# Patient Record
Sex: Female | Born: 2000
Health system: Southern US, Community
[De-identification: ages and names within clinical notes are randomized; demographics above are authoritative.]

## PROBLEM LIST (undated history)

## (undated) DIAGNOSIS — K8020 Calculus of gallbladder without cholecystitis without obstruction: Secondary | ICD-10-CM

## (undated) DIAGNOSIS — F4310 Post-traumatic stress disorder, unspecified: Secondary | ICD-10-CM

## (undated) DIAGNOSIS — B009 Herpesviral infection, unspecified: Secondary | ICD-10-CM

## (undated) DIAGNOSIS — T7840XA Allergy, unspecified, initial encounter: Secondary | ICD-10-CM

## (undated) DIAGNOSIS — F909 Attention-deficit hyperactivity disorder, unspecified type: Secondary | ICD-10-CM

## (undated) DIAGNOSIS — F32A Depression, unspecified: Secondary | ICD-10-CM

## (undated) DIAGNOSIS — F329 Major depressive disorder, single episode, unspecified: Secondary | ICD-10-CM

## (undated) DIAGNOSIS — F913 Oppositional defiant disorder: Secondary | ICD-10-CM

## (undated) DIAGNOSIS — R569 Unspecified convulsions: Secondary | ICD-10-CM

## (undated) HISTORY — PX: TONSILLECTOMY: SUR1361

## (undated) HISTORY — PX: APPENDECTOMY: SHX54

---
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
## 2019-01-12 DIAGNOSIS — B009 Herpesviral infection, unspecified: Secondary | ICD-10-CM | POA: Insufficient documentation

## 2019-01-12 DIAGNOSIS — Z3682 Encounter for antenatal screening for nuchal translucency: Secondary | ICD-10-CM | POA: Diagnosis not present

## 2019-01-12 DIAGNOSIS — O98519 Other viral diseases complicating pregnancy, unspecified trimester: Secondary | ICD-10-CM | POA: Insufficient documentation

## 2019-01-12 DIAGNOSIS — Z3A12 12 weeks gestation of pregnancy: Secondary | ICD-10-CM | POA: Diagnosis not present

## 2019-03-05 DIAGNOSIS — F439 Reaction to severe stress, unspecified: Secondary | ICD-10-CM | POA: Diagnosis not present

## 2019-03-27 DIAGNOSIS — Z3A22 22 weeks gestation of pregnancy: Secondary | ICD-10-CM | POA: Diagnosis not present

## 2019-03-27 DIAGNOSIS — Z3689 Encounter for other specified antenatal screening: Secondary | ICD-10-CM | POA: Diagnosis not present

## 2019-06-29 IMAGING — CT CT ABD-PELV W/ CM
2 of 4 series · 16 of 46 positions shown, 18 images · IV contrast (Isovue)
Comparison: 01/18/2013

CLINICAL DATA: Left lower quadrant abdominal pain

EXAM:
CT ABDOMEN AND PELVIS WITH CONTRAST
TECHNIQUE: Multidetector CT imaging of the abdomen and pelvis was performed
using the standard protocol following bolus administration of
intravenous contrast.
CONTRAST:  100mL [id number] IOPAMIDOL ([id number]) INJECTION 61%

[Series 2: axial st · axial · 0.73mm/px · z∈[+1091,+1481]mm · 13 of 86 slices shown, 15 images]
[im 4/86  soft-tissue]
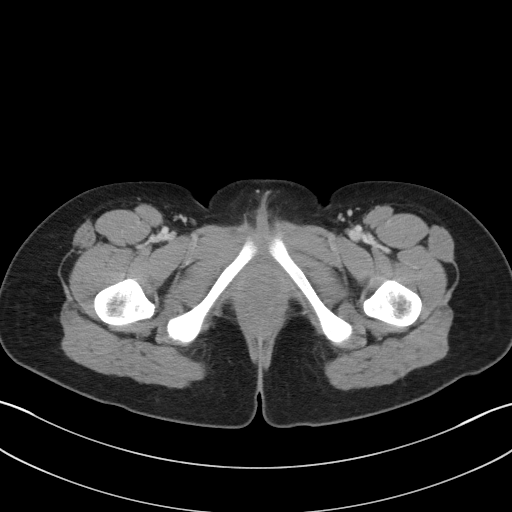
[im 4/86  bone]
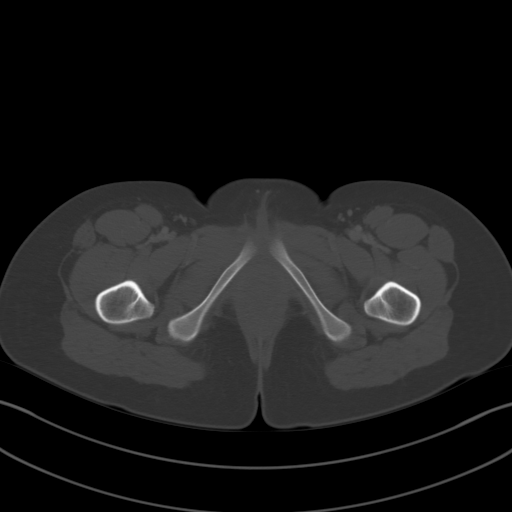
[im 12/86  soft-tissue]
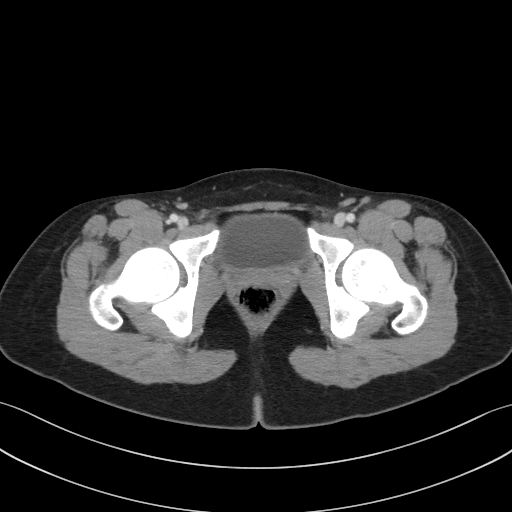
[im 19/86  soft-tissue]
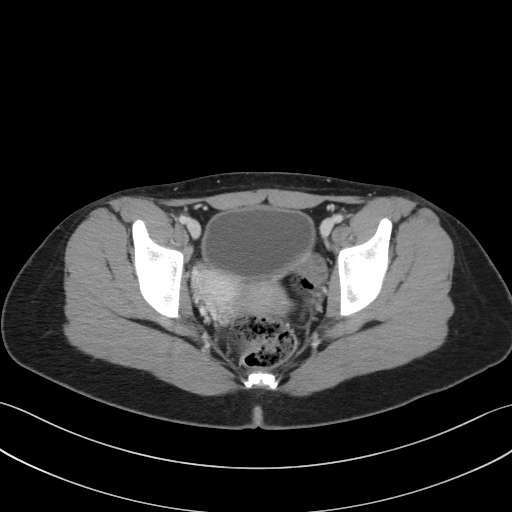
[im 23/86  soft-tissue]
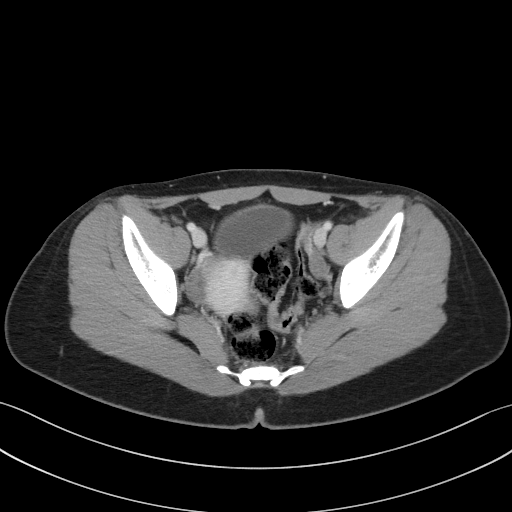
[im 30/86  soft-tissue]
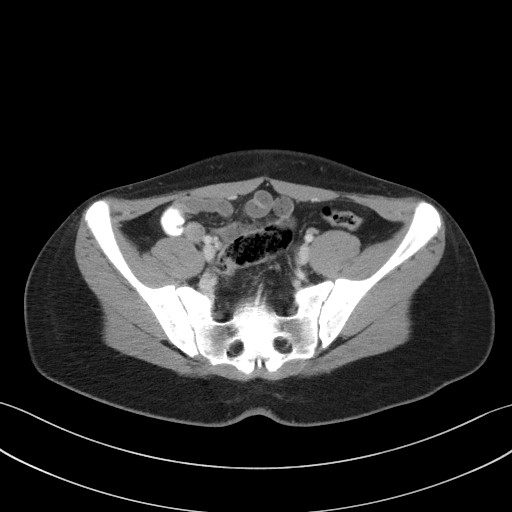
[im 37/86  soft-tissue]
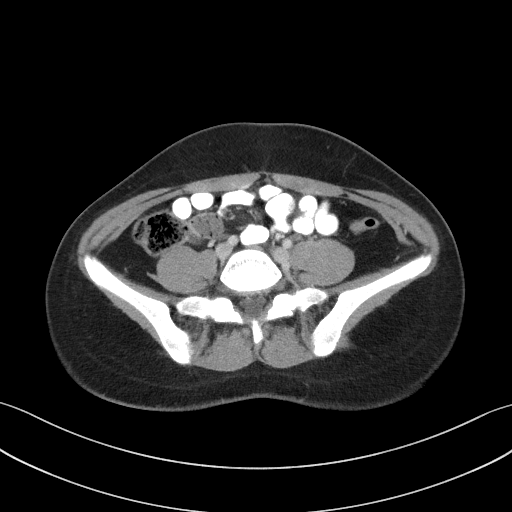
[im 45/86  soft-tissue]
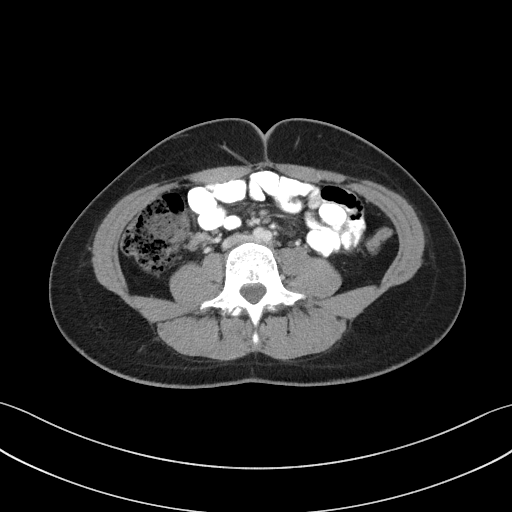
[im 49/86  soft-tissue]
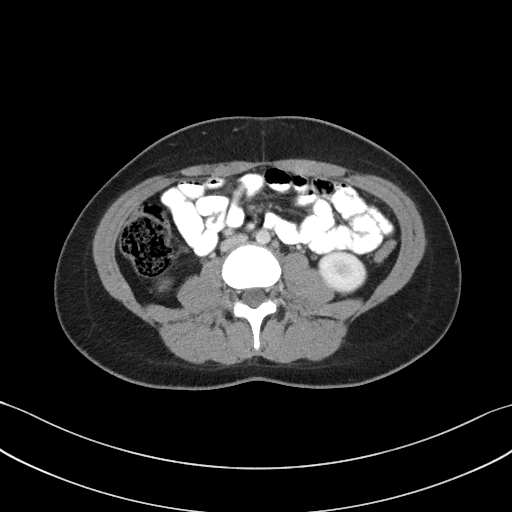
[im 56/86  soft-tissue]
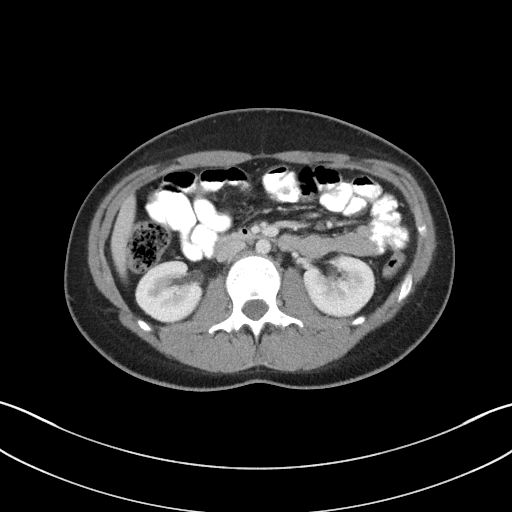
[im 56/86  bone]
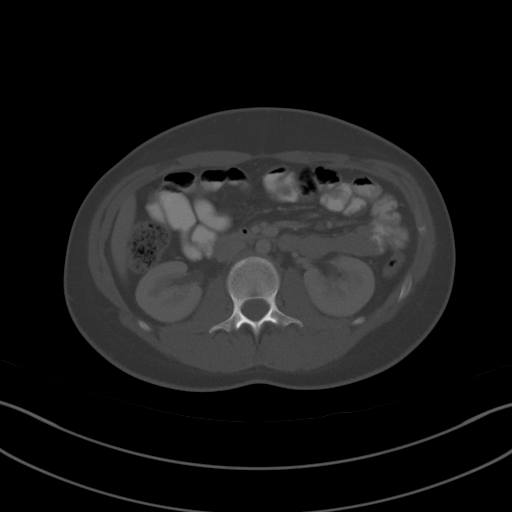
[im 63/86  soft-tissue]
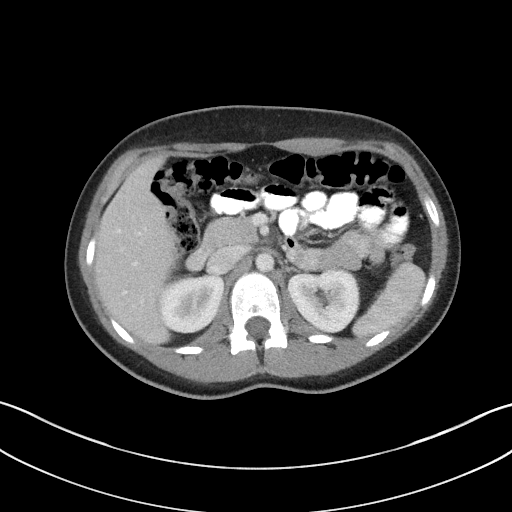
[im 67/86  soft-tissue]
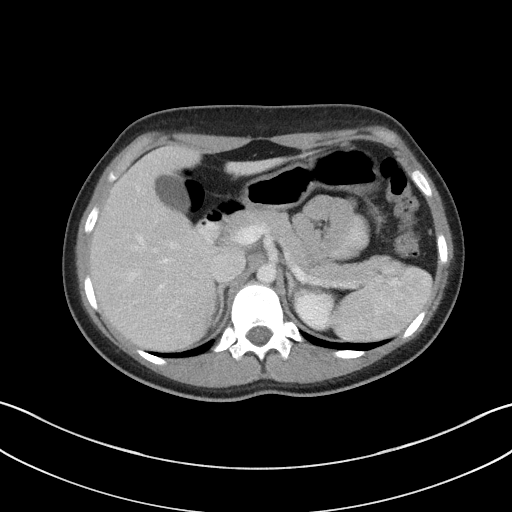
[im 74/86  soft-tissue]
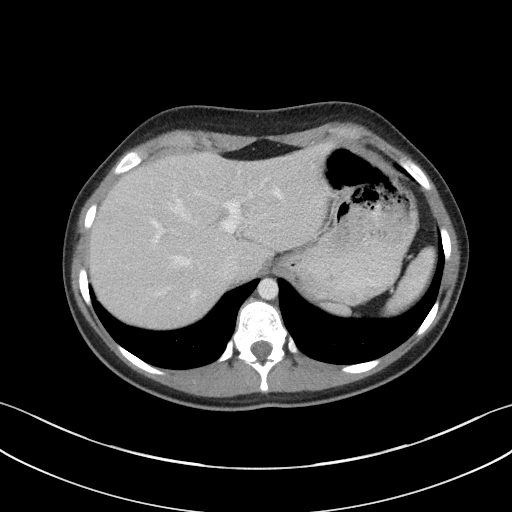
[im 82/86  soft-tissue]
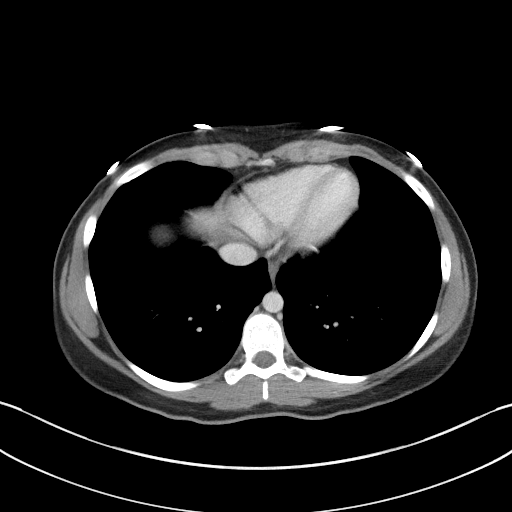

[Series 5: coronal st · coronal · 0.75mm/px · 3 of 62 slices shown]
[im 21/62  soft-tissue]
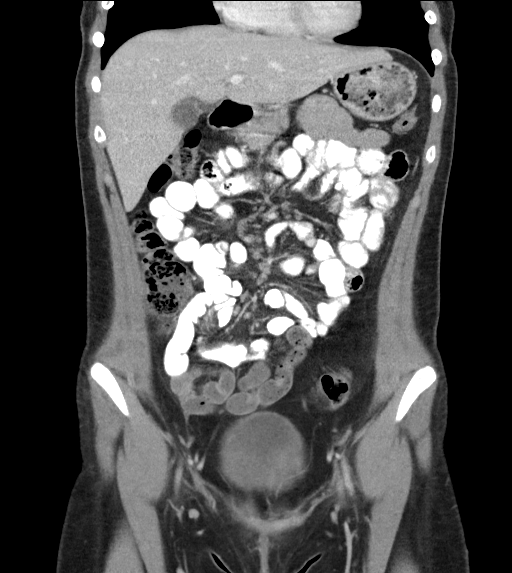
[im 28/62  soft-tissue]
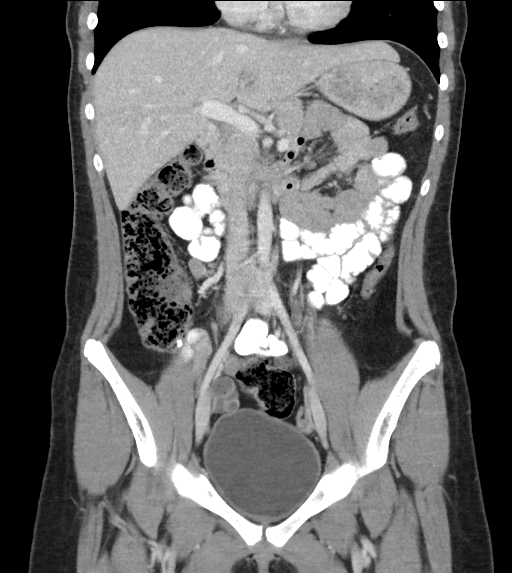
[im 34/62  soft-tissue]
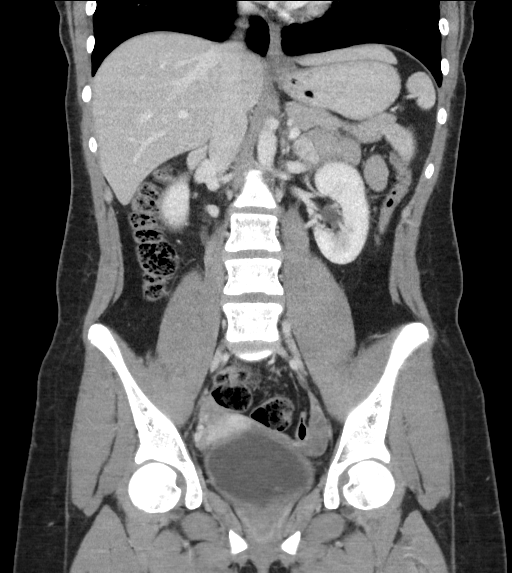

[16 of 46 positions shown; findings below may reference images not displayed]

FINDINGS: LOWER CHEST: No basilar pulmonary nodules or pleural effusion. No
apical pericardial effusion.

HEPATOBILIARY: Normal hepatic contours and density. No intra- or
extrahepatic biliary dilatation. Normal gallbladder.

PANCREAS: Normal parenchymal contours without ductal dilatation. No
peripancreatic fluid collection.

SPLEEN: Normal.

ADRENALS/URINARY TRACT:

--Adrenal glands: Normal.

--Right kidney/ureter: No hydronephrosis, nephroureterolithiasis,
perinephric stranding or solid renal mass.

--Left kidney/ureter: No hydronephrosis, nephroureterolithiasis,
perinephric stranding or solid renal mass.

--Urinary bladder: Normal for degree of distention

STOMACH/BOWEL:

--Stomach/Duodenum: No hiatal hernia or other gastric abnormality.
Normal duodenal course.

--Small bowel: No dilatation or inflammation.

--Colon: No focal abnormality.

--Appendix: Surgically absent.

VASCULAR/LYMPHATIC: Normal course and caliber of the major abdominal
vessels. No abdominal or pelvic lymphadenopathy.

REPRODUCTIVE: Normal uterus and ovaries.

MUSCULOSKELETAL. No bony spinal canal stenosis or focal osseous
abnormality.

OTHER: None.
IMPRESSION: No acute abnormality of the abdomen or pelvis.

## 2019-07-15 DIAGNOSIS — D649 Anemia, unspecified: Secondary | ICD-10-CM | POA: Diagnosis not present

## 2019-07-15 DIAGNOSIS — Z3A38 38 weeks gestation of pregnancy: Secondary | ICD-10-CM | POA: Diagnosis not present

## 2019-07-15 DIAGNOSIS — O9081 Anemia of the puerperium: Secondary | ICD-10-CM | POA: Diagnosis not present

## 2019-11-16 ENCOUNTER — Other Ambulatory Visit: Payer: Self-pay

## 2019-11-16 ENCOUNTER — Encounter: Payer: Self-pay | Admitting: *Deleted

## 2019-11-16 ENCOUNTER — Ambulatory Visit (INDEPENDENT_AMBULATORY_CARE_PROVIDER_SITE_OTHER): Payer: 59 | Admitting: *Deleted

## 2019-11-16 VITALS — BP 129/80 | HR 84 | Ht 63.0 in | Wt 154.4 lb

## 2019-11-16 DIAGNOSIS — Z3201 Encounter for pregnancy test, result positive: Secondary | ICD-10-CM | POA: Diagnosis not present

## 2019-11-16 LAB — POCT URINE PREGNANCY: Preg Test, Ur: POSITIVE — AB

## 2019-11-16 NOTE — Progress Notes (Signed)
Chart reviewed for nurse visit. Agree with plan of care.  Cyril Mourning A, NP 11/16/2019 3:00 PM

## 2019-11-16 NOTE — Progress Notes (Signed)
° °  NURSE VISIT- PREGNANCY CONFIRMATION   SUBJECTIVE:  Frances [name] is a 19 y.o. G90P1001 female at [redacted]w[redacted]d by certain LMP of Patient's last menstrual period was 09/26/2019. Here for pregnancy confirmation.  Home pregnancy test: 3  She reports nausea.  She is taking prenatal vitamins.    OBJECTIVE:  BP 129/80 (BP Location: Right Arm, Patient Position: Sitting, Cuff Size: Normal)    Pulse 84    Ht 5\' 3"  (1.6 m)    Wt 154 lb 6.4 oz (70 kg)    LMP 09/26/2019    BMI 27.35 kg/m   Appears well, in no apparent distress OB History  Gravida Para Term Preterm AB Living  2 1 1     1   SAB TAB Ectopic Multiple Live Births          1    # Outcome Date GA Lbr Len/2nd Weight Sex Delivery Anes PTL Lv  2 Current           1 Term 07/15/19    F Vag-Spont   LIV    Results for orders placed or performed in visit on 11/16/19 (from the past 24 hour(s))  POCT urine pregnancy   Collection Time: 11/16/19  2:53 PM  Result Value Ref Range   Preg Test, Ur Positive (A) Negative    ASSESSMENT: Positive pregnancy test, [redacted]w[redacted]d by LMP    PLAN: Schedule for dating ultrasound in 2 weeks Prenatal vitamins: continue   Nausea medicines: not currently needed   OB packet given: Yes  11/18/19  11/16/2019 2:57 PM

## 2019-12-08 ENCOUNTER — Other Ambulatory Visit: Payer: Self-pay | Admitting: Obstetrics & Gynecology

## 2019-12-08 DIAGNOSIS — O3680X0 Pregnancy with inconclusive fetal viability, not applicable or unspecified: Secondary | ICD-10-CM

## 2019-12-09 ENCOUNTER — Ambulatory Visit (INDEPENDENT_AMBULATORY_CARE_PROVIDER_SITE_OTHER): Payer: Medicaid Other

## 2019-12-09 DIAGNOSIS — O3680X0 Pregnancy with inconclusive fetal viability, not applicable or unspecified: Secondary | ICD-10-CM | POA: Diagnosis not present

## 2019-12-09 DIAGNOSIS — Z3A10 10 weeks gestation of pregnancy: Secondary | ICD-10-CM

## 2019-12-09 NOTE — Progress Notes (Signed)
[place] 9+1 wks,single IUP with YS,positive fht 180 BPM,CRL 25.05 mm,normal ovaries,small subchorionic hemorrhage 1.7 x 1.5 x 1 cm

## 2019-12-29 ENCOUNTER — Other Ambulatory Visit: Payer: Self-pay | Admitting: Obstetrics & Gynecology

## 2019-12-29 ENCOUNTER — Encounter: Payer: Self-pay | Admitting: Advanced Practice Midwife

## 2019-12-29 DIAGNOSIS — B009 Herpesviral infection, unspecified: Secondary | ICD-10-CM | POA: Insufficient documentation

## 2019-12-29 DIAGNOSIS — Z3490 Encounter for supervision of normal pregnancy, unspecified, unspecified trimester: Secondary | ICD-10-CM | POA: Insufficient documentation

## 2019-12-29 DIAGNOSIS — O09899 Supervision of other high risk pregnancies, unspecified trimester: Secondary | ICD-10-CM | POA: Insufficient documentation

## 2019-12-29 DIAGNOSIS — Z3682 Encounter for antenatal screening for nuchal translucency: Secondary | ICD-10-CM

## 2019-12-30 ENCOUNTER — Other Ambulatory Visit: Payer: Self-pay

## 2019-12-30 ENCOUNTER — Ambulatory Visit (INDEPENDENT_AMBULATORY_CARE_PROVIDER_SITE_OTHER): Payer: Medicaid Other | Admitting: Advanced Practice Midwife

## 2019-12-30 ENCOUNTER — Ambulatory Visit: Payer: Medicaid Other | Admitting: *Deleted

## 2019-12-30 ENCOUNTER — Encounter: Payer: Self-pay | Admitting: Advanced Practice Midwife

## 2019-12-30 ENCOUNTER — Ambulatory Visit (INDEPENDENT_AMBULATORY_CARE_PROVIDER_SITE_OTHER): Payer: 59

## 2019-12-30 VITALS — BP 112/83 | HR 86 | Wt 148.0 lb

## 2019-12-30 DIAGNOSIS — Z3A12 12 weeks gestation of pregnancy: Secondary | ICD-10-CM

## 2019-12-30 DIAGNOSIS — Z3480 Encounter for supervision of other normal pregnancy, unspecified trimester: Secondary | ICD-10-CM

## 2019-12-30 DIAGNOSIS — B009 Herpesviral infection, unspecified: Secondary | ICD-10-CM

## 2019-12-30 DIAGNOSIS — Z331 Pregnant state, incidental: Secondary | ICD-10-CM

## 2019-12-30 DIAGNOSIS — Z3400 Encounter for supervision of normal first pregnancy, unspecified trimester: Secondary | ICD-10-CM | POA: Diagnosis not present

## 2019-12-30 DIAGNOSIS — Z3682 Encounter for antenatal screening for nuchal translucency: Secondary | ICD-10-CM | POA: Diagnosis not present

## 2019-12-30 DIAGNOSIS — O09899 Supervision of other high risk pregnancies, unspecified trimester: Secondary | ICD-10-CM

## 2019-12-30 DIAGNOSIS — Z1389 Encounter for screening for other disorder: Secondary | ICD-10-CM

## 2019-12-30 LAB — POCT URINALYSIS DIPSTICK OB
Blood, UA: NEGATIVE
Glucose, UA: NEGATIVE
Ketones, UA: NEGATIVE
Leukocytes, UA: NEGATIVE
Nitrite, UA: NEGATIVE
POC,PROTEIN,UA: NEGATIVE

## 2019-12-30 NOTE — Progress Notes (Signed)
[place] 12+1 wks,measurements c/w dates,crl 65.37 mm,fhr 169 bpm,posterior placenta,normal ovaries,NB present,NT 1.3 mm

## 2019-12-30 NOTE — Patient Instructions (Signed)
Elmer Picker, I greatly value your feedback.  If you receive a survey following your visit with [place] today, we appreciate you taking the time to fill it out.  Thanks, Cathie Beams, DNP, CNM  [facility] HAS MOVED!!! It is now [facility] at [facility] ([address] [place], [place] [address]) Entrance located off of [address] Free 24/7 valet parking   Nausea & Vomiting  Have saltine crackers or pretzels by your bed and eat a few bites before you raise your head out of bed in the morning  Eat small frequent meals throughout the day instead of large meals  Drink plenty of fluids throughout the day to stay hydrated, just don't drink a lot of fluids with your meals.  This can make your stomach fill up faster making you feel sick  Do not brush your teeth right after you eat  Products with real ginger are good for nausea, like ginger ale and ginger hard candy Make sure it says made with real ginger!  Sucking on sour candy like lemon heads is also good for nausea  If your prenatal vitamins make you nauseated, take them at night so you will sleep through the nausea  Sea Bands  If you feel like you need medicine for the nausea & vomiting please let us know  If you are unable to keep any fluids or food down please let us know   Constipation  Drink plenty of fluid, preferably water, throughout the day  Eat foods high in fiber such as fruits, vegetables, and grains  Exercise, such as walking, is a good way to keep your bowels regular  Drink warm fluids, especially warm prune juice, or decaf coffee  Eat a 1/2 cup of real oatmeal (not instant), 1/2 cup applesauce, and 1/2-1 cup warm prune juice every day  If needed, you may take Colace (docusate sodium) stool softener once or twice a day to help keep the stool soft.   If you still are having problems with constipation, you may take Miralax once daily as needed to help keep your bowels regular.    Home Blood Pressure Monitoring for Patients   Your provider has recommended that you check your blood pressure (BP) at least once a week at home. If you do not have a blood pressure cuff at home, one will be provided for you. Contact your provider if you have not received your monitor within 1 week.   Helpful Tips for Accurate Home Blood Pressure Checks  . Don't smoke, exercise, or drink caffeine 30 minutes before checking your BP . Use the restroom before checking your BP (a full bladder can raise your pressure) . Relax in a comfortable upright chair . Feet on the ground . Left arm resting comfortably on a flat surface at the level of your heart . Legs uncrossed . Back supported . Sit quietly and don't talk . Place the cuff on your bare arm . Adjust snuggly, so that only two fingertips can fit between your skin and the top of the cuff . Check 2 readings separated by at least one minute . Keep a log of your BP readings . For a visual, please reference this diagram: http://ccnc.care/bpdiagram  Provider Name: [facility]     Phone: [phone]  Zone 1: ALL CLEAR  Continue to monitor your symptoms:  . BP reading is less than 140 (top number) or less than 90 (bottom number)  . No right upper stomach pain .  No headaches or seeing spots . No feeling nauseated or throwing up . No swelling in face and hands  Zone 2: CAUTION Call your doctor's office for any of the following:  . BP reading is greater than 140 (top number) or greater than 90 (bottom number)  . Stomach pain under your ribs in the middle or right side . Headaches or seeing spots . Feeling nauseated or throwing up . Swelling in face and hands  Zone 3: EMERGENCY  Seek immediate medical care if you have any of the following:  . BP reading is greater than160 (top number) or greater than 110 (bottom number) . Severe headaches not improving with Tylenol . Serious difficulty catching your breath . Any worsening  symptoms from Zone 2    First Trimester of Pregnancy The first trimester of pregnancy is from week 1 until the end of week 12 (months 1 through 3). A week after a sperm fertilizes an egg, the egg will implant on the wall of the uterus. This embryo will begin to develop into a baby. Genes from you and your partner are forming the baby. The female genes determine whether the baby is a boy or a girl. At 6-8 weeks, the eyes and face are formed, and the heartbeat can be seen on ultrasound. At the end of 12 weeks, all the baby's organs are formed.  Now that you are pregnant, you will want to do everything you can to have a healthy baby. Two of the most important things are to get good prenatal care and to follow your health care provider's instructions. Prenatal care is all the medical care you receive before the baby's birth. This care will help prevent, find, and treat any problems during the pregnancy and childbirth. BODY CHANGES Your body goes through many changes during pregnancy. The changes vary from woman to woman.   You may gain or lose a couple of pounds at first.  You may feel sick to your stomach (nauseous) and throw up (vomit). If the vomiting is uncontrollable, call your health care provider.  You may tire easily.  You may develop headaches that can be relieved by medicines approved by your health care provider.  You may urinate more often. Painful urination may mean you have a bladder infection.  You may develop heartburn as a result of your pregnancy.  You may develop constipation because certain hormones are causing the muscles that push waste through your intestines to slow down.  You may develop hemorrhoids or swollen, bulging veins (varicose veins).  Your breasts may begin to grow larger and become tender. Your nipples may stick out more, and the tissue that surrounds them (areola) may become darker.  Your gums may bleed and may be sensitive to brushing and flossing.  Dark  spots or blotches (chloasma, mask of pregnancy) may develop on your face. This will likely fade after the baby is born.  Your menstrual periods will stop.  You may have a loss of appetite.  You may develop cravings for certain kinds of food.  You may have changes in your emotions from day to day, such as being excited to be pregnant or being concerned that something may go wrong with the pregnancy and baby.  You may have more vivid and strange dreams.  You may have changes in your hair. These can include thickening of your hair, rapid growth, and changes in texture. Some women also have hair loss during or after pregnancy, or hair that feels dry  or thin. Your hair will most likely return to normal after your baby is born. WHAT TO EXPECT AT YOUR PRENATAL VISITS During a routine prenatal visit:  You will be weighed to make sure you and the baby are growing normally.  Your blood pressure will be taken.  Your abdomen will be measured to track your baby's growth.  The fetal heartbeat will be listened to starting around week 10 or 12 of your pregnancy.  Test results from any previous visits will be discussed. Your health care provider may ask you:  How you are feeling.  If you are feeling the baby move.  If you have had any abnormal symptoms, such as leaking fluid, bleeding, severe headaches, or abdominal cramping.  If you have any questions. Other tests that may be performed during your first trimester include:  Blood tests to find your blood type and to check for the presence of any previous infections. They will also be used to check for low iron levels (anemia) and Rh antibodies. Later in the pregnancy, blood tests for diabetes will be done along with other tests if problems develop.  Urine tests to check for infections, diabetes, or protein in the urine.  An ultrasound to confirm the proper growth and development of the baby.  An amniocentesis to check for possible genetic  problems.  Fetal screens for spina bifida and Down syndrome.  You may need other tests to make sure you and the baby are doing well. HOME CARE INSTRUCTIONS  Medicines  Follow your health care provider's instructions regarding medicine use. Specific medicines may be either safe or unsafe to take during pregnancy.  Take your prenatal vitamins as directed.  If you develop constipation, try taking a stool softener if your health care provider approves. Diet  Eat regular, well-balanced meals. Choose a variety of foods, such as meat or vegetable-based protein, fish, milk and low-fat dairy products, vegetables, fruits, and whole grain breads and cereals. Your health care provider will help you determine the amount of weight gain that is right for you.  Avoid raw meat and uncooked cheese. These carry germs that can cause birth defects in the baby.  Eating four or five small meals rather than three large meals a day may help relieve nausea and vomiting. If you start to feel nauseous, eating a few soda crackers can be helpful. Drinking liquids between meals instead of during meals also seems to help nausea and vomiting.  If you develop constipation, eat more high-fiber foods, such as fresh vegetables or fruit and whole grains. Drink enough fluids to keep your urine clear or pale yellow. Activity and Exercise  Exercise only as directed by your health care provider. Exercising will help you:  Control your weight.  Stay in shape.  Be prepared for labor and delivery.  Experiencing pain or cramping in the lower abdomen or low back is a good sign that you should stop exercising. Check with your health care provider before continuing normal exercises.  Try to avoid standing for long periods of time. Move your legs often if you must stand in one place for a long time.  Avoid heavy lifting.  Wear low-heeled shoes, and practice good posture.  You may continue to have sex unless your health care  provider directs you otherwise. Relief of Pain or Discomfort  Wear a good support bra for breast tenderness.    Take warm sitz baths to soothe any pain or discomfort caused by hemorrhoids. Use hemorrhoid cream if your  health care provider approves.    Rest with your legs elevated if you have leg cramps or low back pain.  If you develop varicose veins in your legs, wear support hose. Elevate your feet for 15 minutes, 3-4 times a day. Limit salt in your diet. Prenatal Care  Schedule your prenatal visits by the twelfth week of pregnancy. They are usually scheduled monthly at first, then more often in the last 2 months before delivery.  Write down your questions. Take them to your prenatal visits.  Keep all your prenatal visits as directed by your health care provider. Safety  Wear your seat belt at all times when driving.  Make a list of emergency phone numbers, including numbers for family, friends, the hospital, and police and fire departments. General Tips  Ask your health care provider for a referral to a local prenatal education class. Begin classes no later than at the beginning of month 6 of your pregnancy.  Ask for help if you have counseling or nutritional needs during pregnancy. Your health care provider can offer advice or refer you to specialists for help with various needs.  Do not use hot tubs, steam rooms, or saunas.  Do not douche or use tampons or scented sanitary pads.  Do not cross your legs for long periods of time.  Avoid cat litter boxes and soil used by cats. These carry germs that can cause birth defects in the baby and possibly loss of the fetus by miscarriage or stillbirth.  Avoid all smoking, herbs, alcohol, and medicines not prescribed by your health care provider. Chemicals in these affect the formation and growth of the baby.  Schedule a dentist appointment. At home, brush your teeth with a soft toothbrush and be gentle when you floss. SEEK MEDICAL  CARE IF:   You have dizziness.  You have mild pelvic cramps, pelvic pressure, or nagging pain in the abdominal area.  You have persistent nausea, vomiting, or diarrhea.  You have a bad smelling vaginal discharge.  You have pain with urination.  You notice increased swelling in your face, hands, legs, or ankles. SEEK IMMEDIATE MEDICAL CARE IF:   You have a fever.  You are leaking fluid from your vagina.  You have spotting or bleeding from your vagina.  You have severe abdominal cramping or pain.  You have rapid weight gain or loss.  You vomit blood or material that looks like coffee grounds.  You are exposed to [place] measles and have never had them.  You are exposed to fifth disease or chickenpox.  You develop a severe headache.  You have shortness of breath.  You have any kind of trauma, such as from a fall or a car accident. Document Released: 02/13/2001 Document Revised: 07/06/2013 Document Reviewed: 12/30/2012 [facility] Patient Information 2015 [place], [place]. This information is not intended to replace advice given to you by your health care provider. Make sure you discuss any questions you have with your health care provider.  Coronavirus (COVID-19) Are you at risk?  Are you at risk for the Coronavirus (COVID-19)?  To be considered HIGH RISK for Coronavirus (COVID-19), you have to meet the following criteria:  . Traveled to [place], [place], [place], [place] or [place];  and have fever, cough, and shortness of breath within the last 2 weeks of travel OR . Been in close contact with a person diagnosed with COVID-19 within the last 2 weeks and have fever, cough, and shortness of breath . IF YOU DO NOT MEET  THESE CRITERIA, YOU ARE CONSIDERED LOW RISK FOR COVID-19.  What to do if you are HIGH RISK for COVID-19?  [name] If you are having a medical emergency, call 911. . Seek medical care right away. Before you go to a doctor's office, urgent care or emergency department,  call ahead and tell them about your recent travel, contact with someone diagnosed with COVID-19, and your symptoms. You should receive instructions from your physician's office regarding next steps of care.  . When you arrive at healthcare provider, tell the healthcare staff immediately you have returned from visiting [place], [place], [place], [place] or [place]; in the last two weeks or you have been in close contact with a person diagnosed with COVID-19 in the last 2 weeks.   . Tell the health care staff about your symptoms: fever, cough and shortness of breath. . After you have been seen by a medical provider, you will be either: o Tested for (COVID-19) and discharged home on quarantine except to seek medical care if symptoms worsen, and asked to  - Stay home and avoid contact with others until you get your results (4-5 days)  - Avoid travel on public transportation if possible (such as bus, train, or airplane) or o Sent to the Emergency Department by EMS for evaluation, COVID-19 testing, and possible admission depending on your condition and test results.  What to do if you are LOW RISK for COVID-19?  Reduce your risk of any infection by using the same precautions used for avoiding the common cold or flu:  [name] Wash your hands often with soap and warm water for at least 20 seconds.  If soap and water are not readily available, use an alcohol-based hand sanitizer with at least 60% alcohol.  . If coughing or sneezing, cover your mouth and nose by coughing or sneezing into the elbow areas of your shirt or coat, into a tissue or into your sleeve (not your hands). . Avoid shaking hands with others and consider head nods or verbal greetings only. . Avoid touching your eyes, nose, or mouth with unwashed hands.  . Avoid close contact with people who are sick. . Avoid places or events with large numbers of people in one location, like concerts or sporting events. . Carefully consider travel plans you have or  are making. . If you are planning any travel outside or inside the [place], visit the CDC's Travelers' Health webpage for the latest health notices. . If you have some symptoms but not all symptoms, continue to monitor at home and seek medical attention if your symptoms worsen. . If you are having a medical emergency, call 911.   [place] / e-Visit: [email or web address]         [facility]: [facility]: [phone]                   [facility]: [phone]     Safe Medications in Pregnancy   Acne: Benzoyl Peroxide Salicylic Acid  Backache/Headache: Tylenol: 2 regular strength every 4 hours OR              2 Extra strength every 6 hours  Colds/Coughs/Allergies: Benadryl (alcohol free) 25 mg every 6 hours as needed Breath right strips Claritin Cepacol throat lozenges Chloraseptic throat spray Cold-Eeze- up to three times per day Cough drops, alcohol free Flonase (by prescription only) Guaifenesin Mucinex Robitussin DM (plain only, alcohol free) Saline  nasal spray/drops Sudafed (pseudoephedrine) & Actifed ** use only after [redacted] weeks gestation and if you do not have high blood pressure Tylenol Vicks Vaporub Zinc lozenges Zyrtec   Constipation: Colace Ducolax suppositories Fleet enema Glycerin suppositories Metamucil Milk of magnesia Miralax Senokot Smooth move tea  Diarrhea: Kaopectate Imodium A-D  *NO pepto Bismol  Hemorrhoids: Anusol Anusol HC Preparation H Tucks  Indigestion: Tums Maalox Mylanta Zantac  Pepcid  Insomnia: Benadryl (alcohol free) 25mg  every 6 hours as needed Tylenol PM Unisom, no Gelcaps  Leg Cramps: Tums MagGel  Nausea/Vomiting:  Bonine Dramamine Emetrol Ginger extract Sea bands Meclizine  Nausea medication to take during pregnancy:  Unisom (doxylamine succinate  25 mg tablets) Take one tablet daily at bedtime. If symptoms are not adequately controlled, the dose can be increased to a maximum recommended dose of two tablets daily (1/2 tablet in the morning, 1/2 tablet mid-afternoon and one at bedtime). Vitamin B6 100mg  tablets. Take one tablet twice a day (up to 200 mg per day).  Skin Rashes: Aveeno products Benadryl cream or 25mg  every 6 hours as needed Calamine Lotion 1% cortisone cream  Yeast infection: Gyne-lotrimin 7 Monistat 7   **If taking multiple medications, please check labels to avoid duplicating the same active ingredients **take medication as directed on the label ** Do not exceed 4000 mg of tylenol in 24 hours **Do not take medications that contain aspirin or ibuprofen   Meet the Provider [place] for [place] is now offering FREE monthly 1-hour virtual Zoom sessions for new, current, and prospective patients.        During these sessions, you can:   Learn about our practice, model of care, services   Get answers to questions about pregnancy and birth during [place] your provider's brain about anything else!    Sessions will be hosted by [employer] for [employer], Engineer, materials, Physicians and Midwives          No registration required      2021 Dates:      All at 6pm     October 21st     November 18th   December 16th     January 20th  February 17th    To join one of these meetings, a few minutes before it is set to start:     Copy/paste the link into your web browser:  [email or web address]    OR  Scan the QR code below (open up your camera and point towards QR code; click on tab that pops up on your phone ("zoom")

## 2019-12-30 NOTE — Progress Notes (Signed)
INITIAL OBSTETRICAL VISIT Patient name: Frances [name] MRN [id number]  Date of birth: 12-07-2000 Chief Complaint:   Initial Prenatal Visit (nt/it, nausea)  History of Present Illness:   Frances [name] is a 19 y.o. G75P1001  female at [redacted]w[redacted]d by [place] at 9 weeks with an Estimated Date of Delivery: 07/12/20 being seen today for her initial obstetrical visit.   Her obstetrical history is significant for short pg interval (2 months). Term SVD w/o problems.   Today she reports no complaints.  Depression screen PHQ 2/9 12/30/2019  Decreased Interest 2  Down, Depressed, Hopeless 2  PHQ - 2 Score 4  Altered sleeping 2  Tired, decreased energy 2  Change in appetite 2  Feeling bad or failure about yourself  0  Trouble concentrating 0  Moving slowly or fidgety/restless 2  Suicidal thoughts 0  PHQ-9 Score 12    Patient's last menstrual period was 09/26/2019. Last pap N/A Review of Systems:   Pertinent items are noted in HPI Denies cramping/contractions, leakage of fluid, vaginal bleeding, abnormal vaginal discharge w/ itching/odor/irritation, headaches, visual changes, shortness of breath, chest pain, abdominal pain, severe nausea/vomiting, or problems with urination or bowel movements unless otherwise stated above.  Pertinent History Reviewed:  Reviewed past medical,surgical, social, obstetrical and family history.  Reviewed problem list, medications and allergies. OB History  Gravida Para Term Preterm AB Living  2 1 1     1   SAB TAB Ectopic Multiple Live Births          1    # Outcome Date GA Lbr Len/2nd Weight Sex Delivery Anes PTL Lv  2 Current           1 Term 07/15/19 [redacted]w[redacted]d  6 lb 4 oz (2.835 kg) F Vag-Spont EPI N LIV   Physical Assessment:   Vitals:   12/30/19 1428  BP: 112/83  Pulse: 86  Weight: 148 lb (67.1 kg)  Body mass index is 26.22 kg/m.       Physical Examination:  General appearance - well appearing, and in no distress  Mental status - alert, oriented to person,  place, and time  Psych:  She has a normal mood and affect  Skin - warm and dry, normal color, no suspicious lesions noted  Chest - effort normal  Heart - normal rate and regular rhythm  Abdomen - soft, nontender  Extremities:  No swelling or varicosities noted     TODAY'S NT 09-30-1986 12+1 wks,measurements c/w dates,crl 65.37 mm,fhr 169 bpm,posterior placenta,normal ovaries,NB present,NT 1.3 mm  Results for orders placed or performed in visit on 12/30/19 (from the past 24 hour(s))  POC Urinalysis Dipstick OB   Collection Time: 12/30/19  3:00 PM  Result Value Ref Range   Color, UA     Clarity, UA     Glucose, UA Negative Negative   Bilirubin, UA     Ketones, UA neg    Spec Grav, UA     Blood, UA neg    pH, UA     POC,PROTEIN,UA Negative Negative, Trace, Small (1+), Moderate (2+), Large (3+), 4+   Urobilinogen, UA     Nitrite, UA neg    Leukocytes, UA Negative Negative   Appearance     Odor      Assessment & Plan:  1) Low-Risk Pregnancy G2P1001 at [redacted]w[redacted]d with an Estimated Date of Delivery: 07/12/20   2) Initial OB visit  3) Short interval bt pregnancies:  Consider pp LARC  Meds: No orders of the defined  types were placed in this encounter.   Initial labs obtained Continue prenatal vitamins Reviewed n/v relief measures and warning s/s to report Reviewed recommended weight gain based on pre-gravid BMI Encouraged well-balanced diet Genetic & carrier screening discussed: requests Panorama, NT/IT and Horizon 14 , declines AFP Ultrasound discussed; fetal survey: requested CCNC completed> form faxed if has or is planning to apply for medicaid The nature of [facility] [employer] with multiple MDs and other Advanced Practice Providers was explained to patient; also emphasized that fellows, residents, and students are part of our team. Has home bp cuff. Check bp weekly, let us know if >140/90.        Scarlette Calico Cresenzo-Dishmon 3:10 PM

## 2019-12-30 NOTE — Progress Notes (Signed)
   NURSE VISIT- [facility]  SUBJECTIVE:  Frances [name] is a 19 y.o. G54P1001 female here for Panorama NIPT and Horizon Carrier Screening . She is [redacted]w[redacted]d pregnant.   OBJECTIVE:  Appears well, in no apparent distress  Blood work drawn from right [facility] without difficulty. 1 attempt(s).   ASSESSMENT: Pregnancy [redacted]w[redacted]d Panorama NIPT and Horizon Carrier Screening  PLAN: [facility] portal information given and instructed patient how to access results   Jobe Marker  12/30/2019 2:45 PM

## 2019-12-31 LAB — INTEGRATED 1

## 2020-01-01 LAB — CBC/D/PLT+RPR+RH+ABO+RUB AB...
Antibody Screen: NEGATIVE
Basophils Absolute: 0 10*3/uL (ref 0.0–0.2)
Basos: 1 %
EOS (ABSOLUTE): 0.3 10*3/uL (ref 0.0–0.4)
Eos: 5 %
HCV Ab: 0.1 s/co ratio (ref 0.0–0.9)
HIV Screen 4th Generation wRfx: NONREACTIVE
Hematocrit: 37.8 % (ref 34.0–46.6)
Hemoglobin: 12.4 g/dL (ref 11.1–15.9)
Hepatitis B Surface Ag: NEGATIVE
Immature Grans (Abs): 0 10*3/uL (ref 0.0–0.1)
Immature Granulocytes: 0 %
Lymphocytes Absolute: 1.6 10*3/uL (ref 0.7–3.1)
Lymphs: 26 %
MCH: 26.1 pg — ABNORMAL LOW (ref 26.6–33.0)
MCHC: 32.8 g/dL (ref 31.5–35.7)
MCV: 79 fL (ref 79–97)
Monocytes Absolute: 0.5 10*3/uL (ref 0.1–0.9)
Monocytes: 8 %
Neutrophils Absolute: 3.6 10*3/uL (ref 1.4–7.0)
Neutrophils: 60 %
Platelets: 277 10*3/uL (ref 150–450)
RBC: 4.76 x10E6/uL (ref 3.77–5.28)
RDW: 15.8 % — ABNORMAL HIGH (ref 11.7–15.4)
RPR Ser Ql: NONREACTIVE
Rh Factor: POSITIVE
Rubella Antibodies, IGG: 5.46 index (ref >0.99)
WBC: 6.1 10*3/uL (ref 3.4–10.8)

## 2020-01-01 LAB — URINE CULTURE

## 2020-01-01 LAB — INTEGRATED 1
Crown Rump Length: 65.4 mm
Gest. Age on Collection Date: 12.7 weeks
Maternal Age at EDD: 20.6 yr
Nuchal Translucency (NT): 1.3 mm
Number of Fetuses: 1
PAPP-A Value: 1184.4 ng/mL
Weight: 148 [lb_av]

## 2020-01-01 LAB — HCV INTERPRETATION

## 2020-01-02 LAB — PMP SCREEN PROFILE (10S), URINE
Amphetamine Scrn, Ur: NEGATIVE ng/mL
BARBITURATE SCREEN URINE: NEGATIVE ng/mL
BENZODIAZEPINE SCREEN, URINE: NEGATIVE ng/mL
CANNABINOIDS UR QL SCN: NEGATIVE ng/mL
Cocaine (Metab) Scrn, Ur: NEGATIVE ng/mL
Creatinine(Crt), U: 232.8 mg/dL (ref 20.0–300.0)
Methadone Screen, Urine: NEGATIVE ng/mL
OXYCODONE+OXYMORPHONE UR QL SCN: NEGATIVE ng/mL
Opiate Scrn, Ur: NEGATIVE ng/mL
Ph of Urine: 5.6 (ref 4.5–8.9)
Phencyclidine Qn, Ur: NEGATIVE ng/mL
Propoxyphene Scrn, Ur: NEGATIVE ng/mL

## 2020-01-03 LAB — GC/CHLAMYDIA PROBE AMP
Chlamydia trachomatis, NAA: NEGATIVE
Neisseria Gonorrhoeae by PCR: NEGATIVE

## 2020-01-13 ENCOUNTER — Encounter: Payer: Self-pay | Admitting: *Deleted

## 2020-01-13 DIAGNOSIS — Z3480 Encounter for supervision of other normal pregnancy, unspecified trimester: Secondary | ICD-10-CM

## 2020-01-27 ENCOUNTER — Encounter: Payer: Self-pay | Admitting: Advanced Practice Midwife

## 2020-01-27 ENCOUNTER — Other Ambulatory Visit: Payer: Self-pay

## 2020-01-27 ENCOUNTER — Ambulatory Visit (INDEPENDENT_AMBULATORY_CARE_PROVIDER_SITE_OTHER): Payer: Medicaid Other | Admitting: Advanced Practice Midwife

## 2020-01-27 VITALS — BP 109/72 | HR 85 | Wt 147.0 lb

## 2020-01-27 DIAGNOSIS — Z1389 Encounter for screening for other disorder: Secondary | ICD-10-CM

## 2020-01-27 DIAGNOSIS — Z331 Pregnant state, incidental: Secondary | ICD-10-CM

## 2020-01-27 DIAGNOSIS — Z1379 Encounter for other screening for genetic and chromosomal anomalies: Secondary | ICD-10-CM

## 2020-01-27 DIAGNOSIS — Z3A16 16 weeks gestation of pregnancy: Secondary | ICD-10-CM

## 2020-01-27 DIAGNOSIS — Z3480 Encounter for supervision of other normal pregnancy, unspecified trimester: Secondary | ICD-10-CM

## 2020-01-27 NOTE — Progress Notes (Signed)
   PRENATAL VISIT NOTE  Subjective:  Frances [name] is a 19 y.o. G2P1001 at [redacted]w[redacted]d being seen today for ongoing prenatal care.  She is currently monitored for the following issues for this low-risk pregnancy and has Oppositional defiant disorder; ADHD (attention deficit hyperactivity disorder), combined type; Insomnia; Nocturia; Severe recurrent major depression without psychotic features (HCC); HSV-2 infection; Encounter for supervision of normal pregnancy, antepartum; and Short interval between pregnancies affecting pregnancy, antepartum on their problem list.  Patient reports no complaints.  Contractions: Not present. Vag. Bleeding: None.   . Denies leaking of fluid.   The following portions of the patient's history were reviewed and updated as appropriate: allergies, current medications, past family history, past medical history, past social history, past surgical history and problem list.   Objective:   Vitals:   01/27/20 1534  BP: 109/72  Pulse: 85  Weight: 147 lb (66.7 kg)    Fetal Status:           General:  Alert, oriented and cooperative. Patient is in no acute distress.  Skin: Skin is warm and dry. No rash noted.   Cardiovascular: Normal heart rate noted  Respiratory: Normal respiratory effort, no problems with respiration noted  Abdomen: Soft, gravid, appropriate for gestational age.  Pain/Pressure: Absent     Pelvic: Cervical exam deferred        Extremities: Normal range of motion.  Edema: None  Mental Status: Normal mood and affect. Normal behavior. Normal judgment and thought content.   Assessment and Plan:  Pregnancy: G2P1001 at [redacted]w[redacted]d 1. Supervision of other normal pregnancy, antepartum  2. Screening for genitourinary condition - POC Urinalysis Dipstick OB  3. Pregnant state, incidental  4. [redacted] weeks gestation of pregnancy   5. Encounter for genetic screening  - [facility] 2  Preterm labor symptoms and general obstetric precautions including but not  limited to vaginal bleeding, contractions, leaking of fluid and fetal movement were reviewed in detail with the patient. Please refer to After Visit Summary for other counseling recommendations.   Return in about 4 weeks (around 02/24/2020).  No future appointments.  Thressa Sheller DNP, CNM  01/27/20  4:15 PM

## 2020-01-30 LAB — INTEGRATED 2
AFP MoM: 1.15
Alpha-Fetoprotein: 40.9 ng/mL
Crown Rump Length: 65.4 mm
DIA MoM: 0.8
DIA Value: 126.8 pg/mL
Estriol, Unconjugated: 1.39 ng/mL
Gest. Age on Collection Date: 12.7 weeks
Gestational Age: 16.7 weeks
Maternal Age at EDD: 20.6 yr
Nuchal Translucency (NT): 1.3 mm
Nuchal Translucency MoM: 0.89
Number of Fetuses: 1
PAPP-A MoM: 1.07
PAPP-A Value: 1184.4 ng/mL
Test Results:: NEGATIVE
Weight: 147 [lb_av]
Weight: 148 [lb_av]
hCG MoM: 0.68
hCG Value: 21.6 IU/mL
uE3 MoM: 1.27

## 2020-02-29 ENCOUNTER — Other Ambulatory Visit: Payer: Self-pay | Admitting: Advanced Practice Midwife

## 2020-02-29 DIAGNOSIS — Z363 Encounter for antenatal screening for malformations: Secondary | ICD-10-CM

## 2020-03-01 ENCOUNTER — Other Ambulatory Visit: Payer: Self-pay

## 2020-03-01 ENCOUNTER — Encounter: Payer: Self-pay | Admitting: Women's Health

## 2020-03-01 ENCOUNTER — Ambulatory Visit (INDEPENDENT_AMBULATORY_CARE_PROVIDER_SITE_OTHER): Payer: Medicaid Other | Admitting: Women's Health

## 2020-03-01 ENCOUNTER — Ambulatory Visit (INDEPENDENT_AMBULATORY_CARE_PROVIDER_SITE_OTHER): Payer: 59

## 2020-03-01 VITALS — BP 108/71 | HR 79 | Wt 148.4 lb

## 2020-03-01 DIAGNOSIS — Z3A21 21 weeks gestation of pregnancy: Secondary | ICD-10-CM | POA: Diagnosis not present

## 2020-03-01 DIAGNOSIS — Z3480 Encounter for supervision of other normal pregnancy, unspecified trimester: Secondary | ICD-10-CM | POA: Diagnosis not present

## 2020-03-01 DIAGNOSIS — O09899 Supervision of other high risk pregnancies, unspecified trimester: Secondary | ICD-10-CM | POA: Diagnosis not present

## 2020-03-01 DIAGNOSIS — Z363 Encounter for antenatal screening for malformations: Secondary | ICD-10-CM

## 2020-03-01 DIAGNOSIS — Z331 Pregnant state, incidental: Secondary | ICD-10-CM

## 2020-03-01 DIAGNOSIS — Z3482 Encounter for supervision of other normal pregnancy, second trimester: Secondary | ICD-10-CM

## 2020-03-01 DIAGNOSIS — Z1389 Encounter for screening for other disorder: Secondary | ICD-10-CM

## 2020-03-01 LAB — POCT URINALYSIS DIPSTICK OB
Blood, UA: NEGATIVE
Glucose, UA: NEGATIVE
Ketones, UA: NEGATIVE
Leukocytes, UA: NEGATIVE
Nitrite, UA: NEGATIVE
POC,PROTEIN,UA: NEGATIVE

## 2020-03-01 NOTE — Progress Notes (Signed)
LOW-RISK PREGNANCY VISIT Patient name: Frances [name] MRN [id number]  Date of birth: 07-18-2000 Chief Complaint:   Routine Prenatal Visit  History of Present Illness:   Frances [name] is a 19 y.o. G83P1001 female at [redacted]w[redacted]d with an Estimated Date of Delivery: 07/12/20 being seen today for ongoing management of a low-risk pregnancy.  Depression screen PHQ 2/9 12/30/2019  Decreased Interest 2  Down, Depressed, Hopeless 2  PHQ - 2 Score 4  Altered sleeping 2  Tired, decreased energy 2  Change in appetite 2  Feeling bad or failure about yourself  0  Trouble concentrating 0  Moving slowly or fidgety/restless 2  Suicidal thoughts 0  PHQ-9 Score 12    Today she reports no complaints. Contractions: Not present. Vag. Bleeding: None.  Movement: Present. denies leaking of fluid. Review of Systems:   Pertinent items are noted in HPI Denies abnormal vaginal discharge w/ itching/odor/irritation, headaches, visual changes, shortness of breath, chest pain, abdominal pain, severe nausea/vomiting, or problems with urination or bowel movements unless otherwise stated above. Pertinent History Reviewed:  Reviewed past medical,surgical, social, obstetrical and family history.  Reviewed problem list, medications and allergies. Physical Assessment:   Vitals:   03/01/20 1134  BP: 108/71  Pulse: 79  Weight: 148 lb 6.4 oz (67.3 kg)  Body mass index is 26.29 kg/m.        Physical Examination:   General appearance: Well appearing, and in no distress  Mental status: Alert, oriented to person, place, and time  Skin: Warm & dry  Cardiovascular: Normal heart rate noted  Respiratory: Normal respiratory effort, no distress  Abdomen: Soft, gravid, nontender  Pelvic: Cervical exam deferred         Extremities: Edema: None  Fetal Status: Fetal Heart Rate (bpm): 142 u/s   Movement: Present   [place] 21 wks,cephalic,cx 3.7 cm,fundal placenta gr 0,normal ovaries,fhr 142 bpm,svp of fluid 4.6 cm,EFW 440 g  78%,anatomy complete,no obvious abnormalities   Chaperone: N/A   Results for orders placed or performed in visit on 03/01/20 (from the past 24 hour(s))  POC Urinalysis Dipstick OB   Collection Time: 03/01/20 11:34 AM  Result Value Ref Range   Color, UA     Clarity, UA     Glucose, UA Negative Negative   Bilirubin, UA     Ketones, UA neg    Spec Grav, UA     Blood, UA neg    pH, UA     POC,PROTEIN,UA Negative Negative, Trace, Small (1+), Moderate (2+), Large (3+), 4+   Urobilinogen, UA     Nitrite, UA neg    Leukocytes, UA Negative Negative   Appearance     Odor      Assessment & Plan:  1) Low-risk pregnancy G2P1001 at [redacted]w[redacted]d with an Estimated Date of Delivery: 07/12/20    Meds: No orders of the defined types were placed in this encounter.  Labs/procedures today: anatomy u/s  Plan:  Continue routine obstetrical care  Next visit: prefers online    Reviewed: Preterm labor symptoms and general obstetric precautions including but not limited to vaginal bleeding, contractions, leaking of fluid and fetal movement were reviewed in detail with the patient.  All questions were answered. Has home bp cuff. Check bp weekly, let us know if >140/90.   Follow-up: Return in about 4 weeks (around 03/29/2020) for LROB, CNM, MyChart Video.  Future Appointments  Date Time Provider Department Center  03/29/2020  2:10 PM Cheral Marker, CNM [facility]  Orders Placed This Encounter  Procedures  . POC Urinalysis Dipstick OB   Cheral Marker CNM, [facility] 03/01/2020 11:53 AM

## 2020-03-01 NOTE — Progress Notes (Signed)
[place] 21 wks,cephalic,cx 3.7 cm,fundal placenta gr 0,normal ovaries,fhr 142 bpm,svp of fluid 4.6 cm,EFW 440 g 78%,anatomy complete,no obvious abnormalities

## 2020-03-05 NOTE — L&D Delivery Note (Signed)
OB/GYN Faculty Practice Delivery Note  Frances [name] is a 20 y.o. G2P1001 s/p SVD at [redacted]w[redacted]d. She was admitted for PTL.   ROM: 0h 60m with clear fluid GBS Status: unknown, received PCN   Maximum Maternal Temperature: 97.6  Labor Progress: . Initial SVE: 3cm. She then progressed to complete.   Delivery Date/Time: [phone] on 4/14 Delivery: Called to room and patient was complete and pushing. Head delivered LOA. No nuchal cord present. Shoulder and body delivered in usual fashion. Infant with spontaneous cry, placed on mother's abdomen, dried and stimulated. Cord clamped x 2 after 1-minute delay, and cut by FOB. Cord blood drawn. Placenta delivered spontaneously with gentle cord traction. Fundus firm with massage and Pitocin. Labia, perineum, vagina, and cervix inspected inspected with no lacerations.  Baby Weight: pending  Placenta: Sent to L&D Complications: None Lacerations: none EBL: 110 mL Analgesia: Epidural   Infant:  APGAR (1 MIN): 8   APGAR (5 MINS): 8   APGAR (10 MINS):     Casper Harrison, MD [facility] Family Medicine Fellow, [facility], [facility] 06/16/2020, 8:11 AM

## 2020-03-23 ENCOUNTER — Encounter: Payer: Self-pay | Admitting: *Deleted

## 2020-03-29 ENCOUNTER — Telehealth: Payer: Medicaid Other | Admitting: Women's Health

## 2020-03-30 ENCOUNTER — Encounter: Payer: Self-pay | Admitting: Obstetrics and Gynecology

## 2020-03-30 ENCOUNTER — Other Ambulatory Visit: Payer: Self-pay

## 2020-03-30 ENCOUNTER — Telehealth (INDEPENDENT_AMBULATORY_CARE_PROVIDER_SITE_OTHER): Payer: 59 | Admitting: Obstetrics and Gynecology

## 2020-03-30 VITALS — BP 115/67 | HR 96

## 2020-03-30 DIAGNOSIS — Z3A25 25 weeks gestation of pregnancy: Secondary | ICD-10-CM | POA: Diagnosis not present

## 2020-03-30 DIAGNOSIS — Z3482 Encounter for supervision of other normal pregnancy, second trimester: Secondary | ICD-10-CM | POA: Diagnosis not present

## 2020-03-30 NOTE — Patient Instructions (Signed)
Oral Glucose Tolerance Test During Pregnancy Why am I having this test? The oral glucose tolerance test (OGTT) is done to check how your body processes blood sugar (glucose). This is one of several tests used to diagnose diabetes that develops during pregnancy (gestational diabetes mellitus). Gestational diabetes is a short-term form of diabetes that some women develop while they are pregnant. It usually occurs during the second trimester of pregnancy and goes away after delivery. Testing, or screening, for gestational diabetes usually occurs at weeks 24-28 of pregnancy. You may have the OGTT test after having a 1-hour glucose screening test if the results from that test indicate that you may have gestational diabetes. This test may also be needed if:  You have a history of gestational diabetes.  There is a history of giving birth to very large babies or of losing pregnancies (having stillbirths).  You have signs and symptoms of diabetes, such as: ? Changes in your eyesight. ? Tingling or numbness in your hands or feet. ? Changes in hunger, thirst, and urination, and these are not explained by your pregnancy. What is being tested? This test measures the amount of glucose in your blood at different times during a period of 3 hours. This shows how well your body can process glucose. What kind of sample is taken? Blood samples are required for this test. They are usually collected by inserting a needle into a blood vessel.   How do I prepare for this test?  For 3 days before your test, eat normally. Have plenty of carbohydrate-rich foods.  Follow instructions from your health care provider about: ? Eating or drinking restrictions on the day of the test. You may be asked not to eat or drink anything other than water (to fast) starting 8-10 hours before the test. ? Changing or stopping your regular medicines. Some medicines may interfere with this test. Tell a health care provider about:  All  medicines you are taking, including vitamins, herbs, eye drops, creams, and over-the-counter medicines.  Any blood disorders you have.  Any surgeries you have had.  Any medical conditions you have. What happens during the test? First, your blood glucose will be measured. This is referred to as your fasting blood glucose because you fasted before the test. Then, you will drink a glucose solution that contains a certain amount of glucose. Your blood glucose will be measured again 1, 2, and 3 hours after you drink the solution. This test takes about 3 hours to complete. You will need to stay at the testing location during this time. During the testing period:  Do not eat or drink anything other than the glucose solution.  Do not exercise.  Do not use any products that contain nicotine or tobacco, such as cigarettes, e-cigarettes, and chewing tobacco. These can affect your test results. If you need help quitting, ask your health care provider. The testing procedure may vary among health care providers and hospitals. How are the results reported? Your results will be reported as milligrams of glucose per deciliter of blood (mg/dL) or millimoles per liter (mmol/L). There is more than one source for screening and diagnosis reference values used to diagnose gestational diabetes. Your health care provider will compare your results to normal values that were established after testing a large group of people (reference values). Reference values may vary among labs and hospitals. For this test (Carpenter-Coustan), reference values are:  Fasting: 95 mg/dL (5.3 mmol/L).  1 hour: 180 mg/dL (10.0 mmol/L).  2 hour:   155 mg/dL (8.6 mmol/L).  3 hour: 140 mg/dL (7.8 mmol/L). What do the results mean? Results below the reference values are considered normal. If two or more of your blood glucose levels are at or above the reference values, you may be diagnosed with gestational diabetes. If only one level is  high, your health care provider may suggest repeat testing or other tests to confirm a diagnosis. Talk with your health care provider about what your results mean. Questions to ask your health care provider Ask your health care provider, or the department that is doing the test:  When will my results be ready?  How will I get my results?  What are my treatment options?  What other tests do I need?  What are my next steps? Summary  The oral glucose tolerance test (OGTT) is one of several tests used to diagnose diabetes that develops during pregnancy (gestational diabetes mellitus). Gestational diabetes is a short-term form of diabetes that some women develop while they are pregnant.  You may have the OGTT test after having a 1-hour glucose screening test if the results from that test show that you may have gestational diabetes. You may also have this test if you have any symptoms or risk factors for this type of diabetes.  Talk with your health care provider about what your results mean. This information is not intended to replace advice given to you by your health care provider. Make sure you discuss any questions you have with your health care provider. Document Revised: 07/30/2019 Document Reviewed: 07/30/2019 Elsevier Patient Education  2021 Elsevier Inc.  

## 2020-03-30 NOTE — Progress Notes (Signed)
   MY CHART VIDEO VIRTUAL OBSTETRICS VISIT ENCOUNTER NOTE  I connected with Frances [name] on 03/30/20 at  2:30 PM EST by My Chart video at home and verified that I am speaking with the correct person using two identifiers. Provider located at [employer] for [employer] at [facility].   I discussed the limitations, risks, security and privacy concerns of performing an evaluation and management service by My Chart video and the availability of in person appointments. I also discussed with the patient that there may be a patient responsible charge related to this service. The patient expressed understanding and agreed to proceed.  Subjective:  Frances [name] is a 20 y.o. G2P1001 at [redacted]w[redacted]d being followed for ongoing prenatal care.  She is currently monitored for the following issues for this low-risk pregnancy and has Oppositional defiant disorder; ADHD (attention deficit hyperactivity disorder), combined type; Insomnia; Nocturia; Severe recurrent major depression without psychotic features (HCC); HSV-2 infection; Encounter for supervision of normal pregnancy, antepartum; and Short interval between pregnancies affecting pregnancy, antepartum on their problem list.  Patient reports no complaints. Reports fetal movement. Denies any contractions, bleeding or leaking of fluid.   The following portions of the patient's history were reviewed and updated as appropriate: allergies, current medications, past family history, past medical history, past social history, past surgical history and problem list.   Objective:   General:  Alert, oriented and cooperative.   Mental Status: Normal mood and affect perceived. Normal judgment and thought content.  Rest of physical exam deferred due to type of encounter  BP 115/67   Pulse 96   LMP 09/26/2019  **Done by patient's own at home BP cuff and scale  Assessment and Plan:  Pregnancy: G2P1001 at [redacted]w[redacted]d  1) Encounter for supervision of other normal  pregnancy in second trimester - Anticipatory guidance for 2 hr GTT - advised to fast after midnight without anything to eat or drink (except for water), will have fasting blood drawn, drink the glucola drink (flavor choices: orange, fruit punch or lemon-lime), have a visit with a provider during the first hour of testing, wait in the lab waiting room to have blood drawn at 1 hour and then 2 hours after finishing glucola drink.  2) [redacted] weeks gestation of pregnancy   Preterm labor symptoms and general obstetric precautions including but not limited to vaginal bleeding, contractions, leaking of fluid and fetal movement were reviewed in detail with the patient.  I discussed the assessment and treatment plan with the patient. The patient was provided an opportunity to ask questions and all were answered. The patient agreed with the plan and demonstrated an understanding of the instructions. The patient was advised to call back or seek an in-person office evaluation/go to [facility] at [facility] for any urgent or concerning symptoms. Please refer to After Visit Summary for other counseling recommendations.   I provided 5 minutes of non-face-to-face time during this encounter. There was 5 minutes of chart review time spent prior to this encounter. Total time spent = 10 minutes.  Return in about 3 weeks (around 04/20/2020) for Return OB 2hr GTT.  No future appointments.  Raelyn Mora, CNM Center for [employer], [facility]

## 2020-04-20 ENCOUNTER — Encounter: Payer: 59 | Admitting: Advanced Practice Midwife

## 2020-04-20 ENCOUNTER — Other Ambulatory Visit: Payer: 59

## 2020-04-25 ENCOUNTER — Encounter: Payer: Self-pay | Admitting: Women's Health

## 2020-04-25 ENCOUNTER — Ambulatory Visit (INDEPENDENT_AMBULATORY_CARE_PROVIDER_SITE_OTHER): Payer: 59 | Admitting: Women's Health

## 2020-04-25 ENCOUNTER — Other Ambulatory Visit: Payer: Self-pay

## 2020-04-25 ENCOUNTER — Other Ambulatory Visit: Payer: 59

## 2020-04-25 VITALS — BP 109/74 | HR 86 | Wt 156.0 lb

## 2020-04-25 DIAGNOSIS — Z23 Encounter for immunization: Secondary | ICD-10-CM | POA: Diagnosis not present

## 2020-04-25 DIAGNOSIS — Z3483 Encounter for supervision of other normal pregnancy, third trimester: Secondary | ICD-10-CM

## 2020-04-25 DIAGNOSIS — Z3A28 28 weeks gestation of pregnancy: Secondary | ICD-10-CM

## 2020-04-25 DIAGNOSIS — Z3480 Encounter for supervision of other normal pregnancy, unspecified trimester: Secondary | ICD-10-CM

## 2020-04-25 NOTE — Patient Instructions (Addendum)
Frances [name], I greatly value your feedback.  If you receive a survey following your visit with [place] today, we appreciate you taking the time to fill it out.  Thanks, Joellyn Haff, CNM, WHNP-BC   [facility] ([address] [place], [place] [address]) Entrance C, located off of [address] valet parking  Go to [email or web address] to register for FREE online childbirth classes   For Dizzy Spells:   This is usually related to either your blood sugar or your blood pressure dropping  Make sure you are staying well hydrated and drinking enough water so that your urine is clear  Eat small frequent meals and snacks containing protein (meat, eggs, nuts, cheese) so that your blood sugar doesn't drop  If you do get dizzy, sit/lay down and get you something to drink and a snack containing protein- you will usually start feeling better in 10-20 minutes    Call the office [phone]) or go to [facility] if:  You begin to have strong, frequent contractions  Your water breaks.  Sometimes it is a big gush of fluid, sometimes it is just a trickle that keeps getting your panties wet or running down your legs  You have vaginal bleeding.  It is normal to have a small amount of spotting if your cervix was checked.   You don't feel your baby moving like normal.  If you don't, get you something to eat and drink and lay down and focus on feeling your baby move.  You should feel at least 10 movements in 2 hours.  If you don't, you should call the office or go to [facility].    Tdap Vaccine  It is recommended that you get the Tdap vaccine during the third trimester of EACH pregnancy to help protect your baby from getting pertussis (whooping cough)  27-36 weeks is the BEST time to do this so that you can pass the protection on to your baby. During pregnancy is better than after pregnancy, but if you are unable to get it during pregnancy it will be offered at  the hospital.   You can get this vaccine with [place], at the health department, your family doctor, or some local pharmacies  Everyone who will be around your baby should also be up-to-date on their vaccines before the baby comes. Adults (who are not pregnant) only need 1 dose of Tdap during adulthood.   [place] Pediatricians/Family Doctors:  [facility] [phone]            [facility] [phone]                 [facility] [phone] (usually not accepting new patients unless you have family there already, you are always welcome to call and ask)       [facility] Department [phone]       [facility] Pediatricians/Family Doctors:   [facility]: [phone]  [facility]: [phone]  [facility]: [phone]  [facility] Doctors:   [facility]: [phone]   [facility]: [phone]  [facility] Doctors:  [facility]: [phone]   Home Blood Pressure Monitoring for Patients   Your provider has recommended that you check your blood pressure (BP) at least once a week at home. If you do not have a blood pressure cuff at home, one will be provided for you. Contact your provider if you have not received your monitor  within 1 week.   Helpful Tips for Accurate Home Blood Pressure Checks  . Don't smoke, exercise, or drink caffeine 30 minutes before checking your BP . Use the restroom before checking your BP (a full bladder can raise your pressure) . Relax in a comfortable upright chair . Feet on the ground . Left arm resting comfortably on a flat surface at the level of your heart . Legs uncrossed . Back supported . Sit quietly and don't talk . Place the cuff on your bare arm . Adjust snuggly, so that only two fingertips can fit between your skin and the top of the cuff . Check 2 readings separated by at least one  minute . Keep a log of your BP readings . For a visual, please reference this diagram: http://ccnc.care/bpdiagram  Provider Name: [facility]     Phone: [phone]  Zone 1: ALL CLEAR  Continue to monitor your symptoms:  . BP reading is less than 140 (top number) or less than 90 (bottom number)  . No right upper stomach pain . No headaches or seeing spots . No feeling nauseated or throwing up . No swelling in face and hands  Zone 2: CAUTION Call your doctor's office for any of the following:  . BP reading is greater than 140 (top number) or greater than 90 (bottom number)  . Stomach pain under your ribs in the middle or right side . Headaches or seeing spots . Feeling nauseated or throwing up . Swelling in face and hands  Zone 3: EMERGENCY  Seek immediate medical care if you have any of the following:  . BP reading is greater than160 (top number) or greater than 110 (bottom number) . Severe headaches not improving with Tylenol . Serious difficulty catching your breath . Any worsening symptoms from Zone 2   Third Trimester of Pregnancy The third trimester is from week 29 through week 42, months 7 through 9. The third trimester is a time when the fetus is growing rapidly. At the end of the ninth month, the fetus is about 20 inches in length and weighs 6-10 pounds.  BODY CHANGES Your body goes through many changes during pregnancy. The changes vary from woman to woman.   Your weight will continue to increase. You can expect to gain 25-35 pounds (11-16 kg) by the end of the pregnancy.  You may begin to get stretch marks on your hips, abdomen, and breasts.  You may urinate more often because the fetus is moving lower into your pelvis and pressing on your bladder.  You may develop or continue to have heartburn as a result of your pregnancy.  You may develop constipation because certain hormones are causing the muscles that push waste through your intestines to slow  down.  You may develop hemorrhoids or swollen, bulging veins (varicose veins).  You may have pelvic pain because of the weight gain and pregnancy hormones relaxing your joints between the bones in your pelvis. Backaches may result from overexertion of the muscles supporting your posture.  You may have changes in your hair. These can include thickening of your hair, rapid growth, and changes in texture. Some women also have hair loss during or after pregnancy, or hair that feels dry or thin. Your hair will most likely return to normal after your baby is born.  Your breasts will continue to grow and be tender. A yellow discharge may leak from your breasts called colostrum.  Your belly button may stick out.  You  may feel short of breath because of your expanding uterus.  You may notice the fetus "dropping," or moving lower in your abdomen.  You may have a bloody mucus discharge. This usually occurs a few days to a week before labor begins.  Your cervix becomes thin and soft (effaced) near your due date. WHAT TO EXPECT AT YOUR PRENATAL EXAMS  You will have prenatal exams every 2 weeks until week 36. Then, you will have weekly prenatal exams. During a routine prenatal visit:  You will be weighed to make sure you and the fetus are growing normally.  Your blood pressure is taken.  Your abdomen will be measured to track your baby's growth.  The fetal heartbeat will be listened to.  Any test results from the previous visit will be discussed.  You may have a cervical check near your due date to see if you have effaced. At around 36 weeks, your caregiver will check your cervix. At the same time, your caregiver will also perform a test on the secretions of the vaginal tissue. This test is to determine if a type of bacteria, Group B streptococcus, is present. Your caregiver will explain this further. Your caregiver may ask you:  What your birth plan is.  How you are feeling.  If you are  feeling the baby move.  If you have had any abnormal symptoms, such as leaking fluid, bleeding, severe headaches, or abdominal cramping.  If you have any questions. Other tests or screenings that may be performed during your third trimester include:  Blood tests that check for low iron levels (anemia).  Fetal testing to check the health, activity level, and growth of the fetus. Testing is done if you have certain medical conditions or if there are problems during the pregnancy. FALSE LABOR You may feel small, irregular contractions that eventually go away. These are called Braxton Hicks contractions, or false labor. Contractions may last for hours, days, or even weeks before true labor sets in. If contractions come at regular intervals, intensify, or become painful, it is best to be seen by your caregiver.  SIGNS OF LABOR   Menstrual-like cramps.  Contractions that are 5 minutes apart or less.  Contractions that start on the top of the uterus and spread down to the lower abdomen and back.  A sense of increased pelvic pressure or back pain.  A watery or bloody mucus discharge that comes from the vagina. If you have any of these signs before the 37th week of pregnancy, call your caregiver right away. You need to go to the hospital to get checked immediately. HOME CARE INSTRUCTIONS   Avoid all smoking, herbs, alcohol, and unprescribed drugs. These chemicals affect the formation and growth of the baby.  Follow your caregiver's instructions regarding medicine use. There are medicines that are either safe or unsafe to take during pregnancy.  Exercise only as directed by your caregiver. Experiencing uterine cramps is a good sign to stop exercising.  Continue to eat regular, healthy meals.  Wear a good support bra for breast tenderness.  Do not use hot tubs, steam rooms, or saunas.  Wear your seat belt at all times when driving.  Avoid raw meat, uncooked cheese, cat litter boxes, and  soil used by cats. These carry germs that can cause birth defects in the baby.  Take your prenatal vitamins.  Try taking a stool softener (if your caregiver approves) if you develop constipation. Eat more high-fiber foods, such as fresh vegetables or fruit  and whole grains. Drink plenty of fluids to keep your urine clear or pale yellow.  Take warm sitz baths to soothe any pain or discomfort caused by hemorrhoids. Use hemorrhoid cream if your caregiver approves.  If you develop varicose veins, wear support hose. Elevate your feet for 15 minutes, 3-4 times a day. Limit salt in your diet.  Avoid heavy lifting, wear low heal shoes, and practice good posture.  Rest a lot with your legs elevated if you have leg cramps or low back pain.  Visit your dentist if you have not gone during your pregnancy. Use a soft toothbrush to brush your teeth and be gentle when you floss.  A sexual relationship may be continued unless your caregiver directs you otherwise.  Do not travel far distances unless it is absolutely necessary and only with the approval of your caregiver.  Take prenatal classes to understand, practice, and ask questions about the labor and delivery.  Make a trial run to the hospital.  Pack your hospital bag.  Prepare the baby's nursery.  Continue to go to all your prenatal visits as directed by your caregiver. SEEK MEDICAL CARE IF:  You are unsure if you are in labor or if your water has broken.  You have dizziness.  You have mild pelvic cramps, pelvic pressure, or nagging pain in your abdominal area.  You have persistent nausea, vomiting, or diarrhea.  You have a bad smelling vaginal discharge.  You have pain with urination. SEEK IMMEDIATE MEDICAL CARE IF:   You have a fever.  You are leaking fluid from your vagina.  You have spotting or bleeding from your vagina.  You have severe abdominal cramping or pain.  You have rapid weight loss or gain.  You have shortness  of breath with chest pain.  You notice sudden or extreme swelling of your face, hands, ankles, feet, or legs.  You have not felt your baby move in over an hour.  You have severe headaches that do not go away with medicine.  You have vision changes. Document Released: 02/13/2001 Document Revised: 02/24/2013 Document Reviewed: 04/22/2012 [facility] Patient Information 2015 [place], [place]. This information is not intended to replace advice given to you by your health care provider. Make sure you discuss any questions you have with your health care provider.

## 2020-04-25 NOTE — Progress Notes (Signed)
   LOW-RISK PREGNANCY VISIT Patient name: Frances [name] MRN [id number]  Date of birth: [birth year] Chief Complaint:   Routine Prenatal Visit  History of Present Illness:   Frances [name] is a 20 y.o. G13P1001 female at [redacted]w[redacted]d with an Estimated Date of Delivery: 07/12/20 being seen today for ongoing management of a low-risk pregnancy.  Depression screen PHQ 2/9 12/30/2019  Decreased Interest 2  Down, Depressed, Hopeless 2  PHQ - 2 Score 4  Altered sleeping 2  Tired, decreased energy 2  Change in appetite 2  Feeling bad or failure about yourself  0  Trouble concentrating 0  Moving slowly or fidgety/restless 2  Suicidal thoughts 0  PHQ-9 Score 12    Today she reports occ dizziness Contractions: Not present. Vag. Bleeding: None.  Movement: Present. denies leaking of fluid. Review of Systems:   Pertinent items are noted in HPI Denies abnormal vaginal discharge w/ itching/odor/irritation, headaches, visual changes, shortness of breath, chest pain, abdominal pain, severe nausea/vomiting, or problems with urination or bowel movements unless otherwise stated above. Pertinent History Reviewed:  Reviewed past medical,surgical, social, obstetrical and family history.  Reviewed problem list, medications and allergies. Physical Assessment:   Vitals:   04/25/20 0923  BP: 109/74  Pulse: 86  Weight: 156 lb (70.8 kg)  Body mass index is 27.63 kg/m.        Physical Examination:   General appearance: Well appearing, and in no distress  Mental status: Alert, oriented to person, place, and time  Skin: Warm & dry  Cardiovascular: Normal heart rate noted  Respiratory: Normal respiratory effort, no distress  Abdomen: Soft, gravid, nontender  Pelvic: Cervical exam deferred         Extremities: Edema: None  Fetal Status: Fetal Heart Rate (bpm): 141 Fundal Height: 29 cm Movement: Present    Chaperone: N/A   No results found for this or any previous visit (from the past 24 hour(s)).   Assessment & Plan:  1) Low-risk pregnancy G2P1001 at [redacted]w[redacted]d with an Estimated Date of Delivery: 07/12/20   2) Occ dizziness, gave printed prevention/relief measures    Meds: No orders of the defined types were placed in this encounter.  Labs/procedures today: tdap and PN2, declined flu shot  Plan:  Continue routine obstetrical care  Next visit: prefers online    Reviewed: Preterm labor symptoms and general obstetric precautions including but not limited to vaginal bleeding, contractions, leaking of fluid and fetal movement were reviewed in detail with the patient.  All questions were answered. Has home bp cuff.  Check bp weekly, let us know if >140/90.   Follow-up: Return in about 4 weeks (around 05/23/2020) for LROB, CNM, MyChart Video.  Future Appointments  Date Time Provider Department Center  05/24/2020  2:30 PM Cheral Marker, CNM [facility]    Orders Placed This Encounter  Procedures  . Tdap vaccine greater than or equal to 7yo IM   Cheral Marker CNM, [facility] 04/25/2020 9:41 AM

## 2020-04-26 LAB — CBC
Hematocrit: 33.5 % — ABNORMAL LOW (ref 34.0–46.6)
Hemoglobin: 11.1 g/dL (ref 11.1–15.9)
MCH: 26.9 pg (ref 26.6–33.0)
MCHC: 33.1 g/dL (ref 31.5–35.7)
MCV: 81 fL (ref 79–97)
Platelets: 252 10*3/uL (ref 150–450)
RBC: 4.13 x10E6/uL (ref 3.77–5.28)
RDW: 13.6 % (ref 11.7–15.4)
WBC: 11.5 10*3/uL — ABNORMAL HIGH (ref 3.4–10.8)

## 2020-04-26 LAB — ANTIBODY SCREEN: Antibody Screen: NEGATIVE

## 2020-04-26 LAB — GLUCOSE TOLERANCE, 2 HOURS W/ 1HR
Glucose, 1 hour: 152 mg/dL (ref 65–179)
Glucose, 2 hour: 94 mg/dL (ref 65–152)
Glucose, Fasting: 73 mg/dL (ref 65–91)

## 2020-04-26 LAB — RPR: RPR Ser Ql: NONREACTIVE

## 2020-04-26 LAB — HIV ANTIBODY (ROUTINE TESTING W REFLEX): HIV Screen 4th Generation wRfx: NONREACTIVE

## 2020-05-24 ENCOUNTER — Encounter: Payer: Self-pay | Admitting: Obstetrics & Gynecology

## 2020-05-24 ENCOUNTER — Other Ambulatory Visit: Payer: Self-pay

## 2020-05-24 ENCOUNTER — Telehealth (INDEPENDENT_AMBULATORY_CARE_PROVIDER_SITE_OTHER): Payer: 59 | Admitting: Obstetrics & Gynecology

## 2020-05-24 DIAGNOSIS — Z3A33 33 weeks gestation of pregnancy: Secondary | ICD-10-CM

## 2020-05-24 DIAGNOSIS — Z3480 Encounter for supervision of other normal pregnancy, unspecified trimester: Secondary | ICD-10-CM

## 2020-05-24 DIAGNOSIS — Z3483 Encounter for supervision of other normal pregnancy, third trimester: Secondary | ICD-10-CM

## 2020-05-24 DIAGNOSIS — O09899 Supervision of other high risk pregnancies, unspecified trimester: Secondary | ICD-10-CM

## 2020-05-24 MED ORDER — ACYCLOVIR 400 MG PO TABS: 400.0000 mg | ORAL_TABLET | Freq: Three times a day (TID) | ORAL | 2 refills | Status: DC

## 2020-05-24 NOTE — Progress Notes (Signed)
I connected with Frances [name] 05/24/20 at  2:30 PM EDT by: MyChart video and verified that I am speaking with the correct person using two identifiers.  Patient is located at home and provider is located at office.     The purpose of this virtual visit is to provide medical care while limiting exposure to the novel coronavirus. I discussed the limitations, risks, security and privacy concerns of performing an evaluation and management service by MyChart video and the availability of in person appointments. I also discussed with the patient that there may be a patient responsible charge related to this service. By engaging in this virtual visit, you consent to the provision of healthcare.  Additionally, you authorize for your insurance to be billed for the services provided during this visit.  The patient expressed understanding and agreed to proceed.  The following staff members participated in the virtual visit:  Maury Dus, RN    PRENATAL VISIT NOTE  Subjective:  Frances [name] is a 20 y.o. G2P1001 at [redacted]w[redacted]d  for phone visit for ongoing prenatal care.  She is currently monitored for the following issues for this low-risk pregnancy and has Oppositional defiant disorder; ADHD (attention deficit hyperactivity disorder), combined type; Insomnia; Nocturia; Severe recurrent major depression without psychotic features (HCC); HSV-2 infection; Encounter for supervision of normal pregnancy, antepartum; and Short interval between pregnancies affecting pregnancy, antepartum on their problem list.  Patient reports no complaints.  Contractions: Not present. Vag. Bleeding: None.  Movement: Present. Denies leaking of fluid.   The following portions of the patient's history were reviewed and updated as appropriate: allergies, current medications, past family history, past medical history, past social history, past surgical history and problem list.   Objective:  There were no vitals filed for this visit.  Self-Obtained  Fetal Status:     Movement: Present     Assessment and Plan:  Pregnancy: G2P1001 at [redacted]w[redacted]d 1. Supervision of other normal pregnancy, antepartum   2. Short interval between pregnancies affecting pregnancy, antepartum   Preterm labor symptoms and general obstetric precautions including but not limited to vaginal bleeding, contractions, leaking of fluid and fetal movement were reviewed in detail with the patient.  Return in about 2 weeks (around 06/07/2020).  Future Appointments  Date Time Provider Department Center  05/24/2020  2:30 PM Lazaro Arms, MD [facility]    Meds ordered this encounter  Medications  . acyclovir (ZOVIRAX) 400 MG tablet    Sig: Take 1 tablet (400 mg total) by mouth 3 (three) times daily.    Dispense:  90 tablet    Refill:  2    Time spent on virtual visit: 10 minutes  Lazaro Arms, MD

## 2020-06-14 ENCOUNTER — Encounter: Payer: Self-pay | Admitting: Obstetrics & Gynecology

## 2020-06-14 ENCOUNTER — Other Ambulatory Visit (HOSPITAL_COMMUNITY)
Admission: RE | Admit: 2020-06-14 | Discharge: 2020-06-14 | Disposition: A | Discharge status: Home / Self Care | Payer: 59 | Source: Ambulatory Visit | Attending: Obstetrics & Gynecology | Admitting: Obstetrics & Gynecology

## 2020-06-14 ENCOUNTER — Other Ambulatory Visit: Payer: Self-pay

## 2020-06-14 ENCOUNTER — Ambulatory Visit (INDEPENDENT_AMBULATORY_CARE_PROVIDER_SITE_OTHER): Payer: 59 | Admitting: Obstetrics & Gynecology

## 2020-06-14 VITALS — BP 117/78 | HR 100 | Wt 162.0 lb

## 2020-06-14 DIAGNOSIS — Z3483 Encounter for supervision of other normal pregnancy, third trimester: Secondary | ICD-10-CM | POA: Insufficient documentation

## 2020-06-14 DIAGNOSIS — Z3A36 36 weeks gestation of pregnancy: Secondary | ICD-10-CM | POA: Insufficient documentation

## 2020-06-14 DIAGNOSIS — Z3482 Encounter for supervision of other normal pregnancy, second trimester: Secondary | ICD-10-CM

## 2020-06-14 NOTE — Progress Notes (Signed)
   LOW-RISK PREGNANCY VISIT Patient name: Frances [name] MRN [id number]  Date of birth: 10/31/2000 Chief Complaint:   Routine Prenatal Visit  History of Present Illness:   Frances [name] is a 20 y.o. G32P1001 female at [redacted]w[redacted]d with an Estimated Date of Delivery: 07/12/20 being seen today for ongoing management of a low-risk pregnancy.  Depression screen [facility] 2/9 04/25/2020 12/30/2019  Decreased Interest 1 2  Down, Depressed, Hopeless 0 2  PHQ - 2 Score 1 4  Altered sleeping 1 2  Tired, decreased energy 1 2  Change in appetite 1 2  Feeling bad or failure about yourself  0 0  Trouble concentrating 0 0  Moving slowly or fidgety/restless 0 2  Suicidal thoughts 0 0  PHQ-9 Score 4 12    Today she reports occasional contractions/irritability. Contractions: Irritability. Vag. Bleeding: None.  Movement: Present. denies leaking of fluid. Review of Systems:   Pertinent items are noted in HPI Denies abnormal vaginal discharge w/ itching/odor/irritation, headaches, visual changes, shortness of breath, chest pain, abdominal pain, severe nausea/vomiting, or problems with urination or bowel movements unless otherwise stated above. Pertinent History Reviewed:  Reviewed past medical,surgical, social, obstetrical and family history.  Reviewed problem list, medications and allergies.  Physical Assessment:   Vitals:   06/14/20 1504  BP: 117/78  Pulse: 100  Weight: 162 lb (73.5 kg)  Body mass index is 28.7 kg/m.        Physical Examination:   General appearance: Well appearing, and in no distress  Mental status: Alert, oriented to person, place, and time  Skin: Warm & dry  Respiratory: Normal respiratory effort, no distress  Abdomen: Soft, gravid, nontender  Pelvic: Cervical exam performed  Dilation: 2 Effacement (%): 70 Station: -2  Extremities: Edema: Trace  Psych:  mood and affect appropriate  Fetal Status: Fetal Heart Rate (bpm): 135 Fundal Height: 35 cm Movement: Present Presentation:  Vertex  Chaperone: Amanda Rash    No results found for this or any previous visit (from the past 24 hour(s)).   Assessment & Plan:  1) Low-risk pregnancy G2P1001 at [redacted]w[redacted]d with an Estimated Date of Delivery: 07/12/20   2) h/o HSV2- on suppression -pt denies h/o HSV, asymptomatic currently and reports she has never had an outbreak []  plan to review chart to find where this history came from Of note- she was on suppression during her last pregnancy as well   Meds: No orders of the defined types were placed in this encounter.  Labs/procedures today: GBS/GC/C collected  Plan:  Continue routine obstetrical care Next visit: prefers in person    Reviewed: Term labor symptoms and general obstetric precautions including but not limited to vaginal bleeding, contractions, leaking of fluid and fetal movement were reviewed in detail with the patient.  All questions were answered. Pt has home bp cuff. Check bp weekly, let know if >140/90.   Follow-up: Return in about 1 week (around 06/21/2020) for LROB visit.  Orders Placed This Encounter  Procedures  . Culture, beta strep (group b only)    06/23/2020, DO Attending Obstetrician & Gynecologist, [facility] for [facility]

## 2020-06-15 ENCOUNTER — Encounter (HOSPITAL_COMMUNITY): Payer: Self-pay | Admitting: Obstetrics and Gynecology

## 2020-06-15 ENCOUNTER — Inpatient Hospital Stay (EMERGENCY_DEPARTMENT_HOSPITAL)
Admission: AD | Admit: 2020-06-15 | Discharge: 2020-06-15 | Disposition: A | Discharge status: Home / Self Care | Payer: 59 | Source: Home / Self Care | Attending: Obstetrics and Gynecology | Admitting: Obstetrics and Gynecology

## 2020-06-15 ENCOUNTER — Other Ambulatory Visit: Payer: Self-pay

## 2020-06-15 DIAGNOSIS — R102 Pelvic and perineal pain: Secondary | ICD-10-CM

## 2020-06-15 DIAGNOSIS — O4703 False labor before 37 completed weeks of gestation, third trimester: Secondary | ICD-10-CM | POA: Insufficient documentation

## 2020-06-15 DIAGNOSIS — O2693 Pregnancy related conditions, unspecified, third trimester: Secondary | ICD-10-CM

## 2020-06-15 DIAGNOSIS — O4700 False labor before 37 completed weeks of gestation, unspecified trimester: Secondary | ICD-10-CM

## 2020-06-15 DIAGNOSIS — O368130 Decreased fetal movements, third trimester, not applicable or unspecified: Secondary | ICD-10-CM

## 2020-06-15 DIAGNOSIS — Z3A36 36 weeks gestation of pregnancy: Secondary | ICD-10-CM

## 2020-06-15 DIAGNOSIS — Z7722 Contact with and (suspected) exposure to environmental tobacco smoke (acute) (chronic): Secondary | ICD-10-CM | POA: Insufficient documentation

## 2020-06-15 DIAGNOSIS — O479 False labor, unspecified: Secondary | ICD-10-CM

## 2020-06-15 NOTE — MAU Provider Note (Signed)
History     CSN: [id number]  Arrival date and time: 06/15/20 2203   None     Chief Complaint  Patient presents with  . Contractions  . Decreased Fetal Movement   HPI Frances [name] is a 19yo G2P1001 @ 36.1wks who presents for eval of vag pressure/ctx. Denies leaking or bldg; no s/s pre-e. She reports that she is meeting [facility] requirements, but that the pattern is different today. Her preg has been followed by the [facility] service and has been remarkable for:  -short interval between pregnancies -hx HSV 2   OB History    Gravida  2   Para  1   Term  1   Preterm      AB      Living  1     SAB      IAB      Ectopic      Multiple      Live Births  1           Past Medical History:  Diagnosis Date  . ADHD (attention deficit hyperactivity disorder)   . Allergy   . Depressed   . ODD (oppositional defiant disorder)   . Post traumatic stress disorder   . PTSD (post-traumatic stress disorder)   . Seizures (HCC)     Past Surgical History:  Procedure Laterality Date  . APPENDECTOMY    . LAPAROSCOPIC APPENDECTOMY N/A 01/18/2013   Procedure: APPENDECTOMY LAPAROSCOPIC;  Surgeon: Judie Petit. Leonia Corona, MD;  Location: [facility] OR;  Service: Pediatrics;  Laterality: N/A;  . TONSILLECTOMY      Family History  Problem Relation Age of Onset  . Cancer Maternal Grandmother   . Heart disease Maternal Grandmother   . Cancer Maternal Grandfather   . Diabetes Maternal Grandfather   . Heart disease Maternal Grandfather   . Cancer Paternal Grandmother   . Heart disease Paternal Grandmother   . Cancer Paternal Grandfather   . Heart disease Paternal Grandfather     Social History   Tobacco Use  . Smoking status: Passive Smoke Exposure - Never Smoker  . Smokeless tobacco: Never Used  Vaping Use  . Vaping Use: Never used  Substance Use Topics  . Alcohol use: No  . Drug use: No    Allergies:  Allergies  Allergen Reactions  . Risperidone And Related Hives  . Shellfish  Allergy Hives, Nausea And Vomiting and Swelling    Medications Prior to Admission  Medication Sig Dispense Refill Last Dose  . acyclovir (ZOVIRAX) 400 MG tablet Take 1 tablet (400 mg total) by mouth 3 (three) times daily. 90 tablet 2   . Prenatal Vit-Fe Fumarate-FA (PRENATAL VITAMIN PO) Take by mouth. (Patient not taking: No sig reported)       Review of Systems No other pertinents other than what is listed in HPI Physical Exam   Blood pressure 127/76, pulse 96, temperature 98.6 F (37 C), resp. rate 16, weight 73.3 kg, last menstrual period 09/26/2019, SpO2 98 %.  Physical Exam Constitutional:      Appearance: Normal appearance.  HENT:     Head: Normocephalic.     Mouth/Throat:     Mouth: Mucous membranes are moist.  Cardiovascular:     Rate and Rhythm: Normal rate.  Pulmonary:     Effort: Pulmonary effort is normal.  Genitourinary:    General: Normal vulva.     Comments: EFM 120-130s, +accels, no decels Ctx irreg 2-5 mins, mild Cx 2/70/vtx -2, unchanged from the office  Musculoskeletal:        General: Normal range of motion.     Cervical back: Normal range of motion.  Skin:    General: Skin is warm and dry.  Neurological:     Mental Status: She is alert.  Psychiatric:        Mood and Affect: Mood normal.     MAU Course  Procedures  MDM NST & cx exam  Assessment and Plan  IUP@36 .1 BH ctx/vag pressure  D/C home with labor/ROM/bldg precautions Reviewed [facility] guidelines F/U at next OB appt as scheduled  Arabella Merles CNM 06/15/2020, 11:08 PM

## 2020-06-15 NOTE — Discharge Instructions (Signed)

## 2020-06-15 NOTE — MAU Note (Signed)
Pt reports ctx's the last 2 weeks. They ctx's have been closer around every 5 minutes for the last hour.   Pt reports pelvic pressure.   Denies vaginal bleeding or LOF.   Reports decreased fetal movement since Monday. Pt reports she talked to her mom about it and her mom told her that was normal because the baby was getting comfortable.

## 2020-06-16 ENCOUNTER — Encounter (HOSPITAL_COMMUNITY): Payer: Self-pay | Admitting: Obstetrics and Gynecology

## 2020-06-16 ENCOUNTER — Inpatient Hospital Stay (HOSPITAL_COMMUNITY): Payer: 59 | Admitting: Anesthesiology

## 2020-06-16 ENCOUNTER — Inpatient Hospital Stay (HOSPITAL_COMMUNITY)
Admission: AD | Admit: 2020-06-16 | Discharge: 2020-06-18 | DRG: 806 | Disposition: A | Discharge status: Home / Self Care | Payer: 59 | Attending: Obstetrics and Gynecology | Admitting: Obstetrics and Gynecology

## 2020-06-16 DIAGNOSIS — O09899 Supervision of other high risk pregnancies, unspecified trimester: Secondary | ICD-10-CM

## 2020-06-16 DIAGNOSIS — F332 Major depressive disorder, recurrent severe without psychotic features: Secondary | ICD-10-CM | POA: Diagnosis present

## 2020-06-16 DIAGNOSIS — Z20822 Contact with and (suspected) exposure to covid-19: Secondary | ICD-10-CM | POA: Diagnosis not present

## 2020-06-16 DIAGNOSIS — A6000 Herpesviral infection of urogenital system, unspecified: Secondary | ICD-10-CM | POA: Diagnosis not present

## 2020-06-16 DIAGNOSIS — O4700 False labor before 37 completed weeks of gestation, unspecified trimester: Secondary | ICD-10-CM | POA: Diagnosis not present

## 2020-06-16 DIAGNOSIS — O9832 Other infections with a predominantly sexual mode of transmission complicating childbirth: Secondary | ICD-10-CM | POA: Diagnosis not present

## 2020-06-16 DIAGNOSIS — O2693 Pregnancy related conditions, unspecified, third trimester: Secondary | ICD-10-CM | POA: Diagnosis not present

## 2020-06-16 DIAGNOSIS — Z7722 Contact with and (suspected) exposure to environmental tobacco smoke (acute) (chronic): Secondary | ICD-10-CM | POA: Diagnosis not present

## 2020-06-16 DIAGNOSIS — Z3A36 36 weeks gestation of pregnancy: Secondary | ICD-10-CM

## 2020-06-16 DIAGNOSIS — R102 Pelvic and perineal pain: Secondary | ICD-10-CM | POA: Diagnosis not present

## 2020-06-16 DIAGNOSIS — Z30017 Encounter for initial prescription of implantable subdermal contraceptive: Secondary | ICD-10-CM

## 2020-06-16 DIAGNOSIS — O368130 Decreased fetal movements, third trimester, not applicable or unspecified: Secondary | ICD-10-CM | POA: Diagnosis not present

## 2020-06-16 LAB — CBC
HCT: 28.9 % — ABNORMAL LOW (ref 36.0–46.0)
Hemoglobin: 9.4 g/dL — ABNORMAL LOW (ref 12.0–15.0)
MCH: 25.2 pg — ABNORMAL LOW (ref 26.0–34.0)
MCHC: 32.5 g/dL (ref 30.0–36.0)
MCV: 77.5 fL — ABNORMAL LOW (ref 80.0–100.0)
Platelets: 260 10*3/uL (ref 150–400)
RBC: 3.73 MIL/uL — ABNORMAL LOW (ref 3.87–5.11)
RDW: 14.7 % (ref 11.5–15.5)
WBC: 16.4 10*3/uL — ABNORMAL HIGH (ref 4.0–10.5)
nRBC: 0 % (ref 0.0–0.2)

## 2020-06-16 LAB — ABO/RH: ABO/RH(D): A POS

## 2020-06-16 LAB — CERVICOVAGINAL ANCILLARY ONLY
Chlamydia: NEGATIVE
Comment: NEGATIVE
Comment: NORMAL
Neisseria Gonorrhea: NEGATIVE

## 2020-06-16 LAB — RPR: RPR Ser Ql: NONREACTIVE

## 2020-06-16 LAB — TYPE AND SCREEN
ABO/RH(D): A POS
Antibody Screen: NEGATIVE

## 2020-06-16 LAB — RESP PANEL BY RT-PCR (FLU A&B, COVID) ARPGX2
Influenza A by PCR: NEGATIVE
Influenza B by PCR: NEGATIVE
SARS Coronavirus 2 by RT PCR: NEGATIVE

## 2020-06-16 MED ORDER — TETANUS-DIPHTH-ACELL PERTUSSIS 5-2.5-18.5 LF-MCG/0.5 IM SUSY: 0.5000 mL | PREFILLED_SYRINGE | Freq: Once | INTRAMUSCULAR | Status: DC

## 2020-06-16 MED ORDER — PRENATAL MULTIVITAMIN CH
1.0000 | ORAL_TABLET | Freq: Every day | ORAL | Status: DC
  Administered 2020-06-16 – 2020-06-18 (×3): 1 via ORAL
  Filled 2020-06-16 (×3): qty 1

## 2020-06-16 MED ORDER — BENZOCAINE-MENTHOL 20-0.5 % EX AERO
1.0000 "application " | INHALATION_SPRAY | CUTANEOUS | Status: DC | PRN
  Administered 2020-06-16: 1 via TOPICAL
  Filled 2020-06-16: qty 56

## 2020-06-16 MED ORDER — ACETAMINOPHEN 325 MG PO TABS: 650.0000 mg | ORAL_TABLET | ORAL | Status: DC | PRN

## 2020-06-16 MED ORDER — LACTATED RINGERS IV SOLN
500.0000 mL | Freq: Once | INTRAVENOUS | Status: AC
  Administered 2020-06-16: 500 mL via INTRAVENOUS

## 2020-06-16 MED ORDER — OXYTOCIN-SODIUM CHLORIDE 30-0.9 UT/500ML-% IV SOLN
2.5000 [IU]/h | INTRAVENOUS | Status: DC
  Filled 2020-06-16: qty 500

## 2020-06-16 MED ORDER — OXYCODONE-ACETAMINOPHEN 5-325 MG PO TABS
2.0000 | ORAL_TABLET | ORAL | Status: DC | PRN
Start: 2020-06-16 — End: 2020-06-16

## 2020-06-16 MED ORDER — DOCUSATE SODIUM 100 MG PO CAPS
100.0000 mg | ORAL_CAPSULE | Freq: Two times a day (BID) | ORAL | Status: DC
  Administered 2020-06-16 – 2020-06-17 (×3): 100 mg via ORAL
  Filled 2020-06-16 (×4): qty 1

## 2020-06-16 MED ORDER — FENTANYL-BUPIVACAINE-NACL 0.5-0.125-0.9 MG/250ML-% EP SOLN
12.0000 mL/h | EPIDURAL | Status: DC | PRN
  Filled 2020-06-16: qty 250

## 2020-06-16 MED ORDER — DIPHENHYDRAMINE HCL 50 MG/ML IJ SOLN: 12.5000 mg | INTRAMUSCULAR | Status: DC | PRN

## 2020-06-16 MED ORDER — SOD CITRATE-CITRIC ACID 500-334 MG/5ML PO SOLN: 30.0000 mL | ORAL | Status: DC | PRN

## 2020-06-16 MED ORDER — COCONUT OIL OIL
1.0000 | TOPICAL_OIL | Status: DC | PRN
Start: 2020-06-16 — End: 2020-06-18

## 2020-06-16 MED ORDER — SIMETHICONE 80 MG PO CHEW: 80.0000 mg | CHEWABLE_TABLET | ORAL | Status: DC | PRN

## 2020-06-16 MED ORDER — LACTATED RINGERS IV SOLN: INTRAVENOUS | Status: DC

## 2020-06-16 MED ORDER — ONDANSETRON HCL 4 MG PO TABS: 4.0000 mg | ORAL_TABLET | ORAL | Status: DC | PRN

## 2020-06-16 MED ORDER — IBUPROFEN 600 MG PO TABS
600.0000 mg | ORAL_TABLET | Freq: Four times a day (QID) | ORAL | Status: DC
  Administered 2020-06-16 – 2020-06-18 (×9): 600 mg via ORAL
  Filled 2020-06-16 (×9): qty 1

## 2020-06-16 MED ORDER — ONDANSETRON HCL 4 MG/2ML IJ SOLN
4.0000 mg | Freq: Four times a day (QID) | INTRAMUSCULAR | Status: DC | PRN
  Administered 2020-06-16: 4 mg via INTRAVENOUS
  Filled 2020-06-16: qty 2

## 2020-06-16 MED ORDER — ONDANSETRON HCL 4 MG/2ML IJ SOLN
4.0000 mg | INTRAMUSCULAR | Status: DC | PRN
Start: 2020-06-16 — End: 2020-06-18

## 2020-06-16 MED ORDER — SENNOSIDES-DOCUSATE SODIUM 8.6-50 MG PO TABS
2.0000 | ORAL_TABLET | ORAL | Status: DC
  Administered 2020-06-16 – 2020-06-17 (×2): 2 via ORAL
  Filled 2020-06-16 (×3): qty 2

## 2020-06-16 MED ORDER — LIDOCAINE HCL (PF) 1 % IJ SOLN: 30.0000 mL | INTRAMUSCULAR | Status: DC | PRN

## 2020-06-16 MED ORDER — WITCH HAZEL-GLYCERIN EX PADS: 1.0000 "application " | MEDICATED_PAD | CUTANEOUS | Status: DC | PRN

## 2020-06-16 MED ORDER — DIPHENHYDRAMINE HCL 25 MG PO CAPS: 25.0000 mg | ORAL_CAPSULE | Freq: Four times a day (QID) | ORAL | Status: DC | PRN

## 2020-06-16 MED ORDER — OXYCODONE-ACETAMINOPHEN 5-325 MG PO TABS: 1.0000 | ORAL_TABLET | ORAL | Status: DC | PRN

## 2020-06-16 MED ORDER — FENTANYL-BUPIVACAINE-NACL 0.5-0.125-0.9 MG/250ML-% EP SOLN
EPIDURAL | Status: DC | PRN
  Administered 2020-06-16: 12 mL/h via EPIDURAL

## 2020-06-16 MED ORDER — DIBUCAINE (PERIANAL) 1 % EX OINT: 1.0000 "application " | TOPICAL_OINTMENT | CUTANEOUS | Status: DC | PRN

## 2020-06-16 MED ORDER — PHENYLEPHRINE 40 MCG/ML (10ML) SYRINGE FOR IV PUSH (FOR BLOOD PRESSURE SUPPORT): 80.0000 ug | PREFILLED_SYRINGE | INTRAVENOUS | Status: DC | PRN

## 2020-06-16 MED ORDER — LACTATED RINGERS IV SOLN
500.0000 mL | INTRAVENOUS | Status: DC | PRN
  Administered 2020-06-16: 500 mL via INTRAVENOUS
  Administered 2020-06-16: 1000 mL via INTRAVENOUS

## 2020-06-16 MED ORDER — EPHEDRINE 5 MG/ML INJ: 10.0000 mg | INTRAVENOUS | Status: DC | PRN

## 2020-06-16 MED ORDER — PENICILLIN G POT IN DEXTROSE 60000 UNIT/ML IV SOLN: 3.0000 10*6.[IU] | INTRAVENOUS | Status: DC

## 2020-06-16 MED ORDER — FLEET ENEMA 7-19 GM/118ML RE ENEM: 1.0000 | ENEMA | RECTAL | Status: DC | PRN

## 2020-06-16 MED ORDER — SODIUM CHLORIDE 0.9 % IV SOLN
5.0000 10*6.[IU] | Freq: Once | INTRAVENOUS | Status: AC
  Administered 2020-06-16: 5 10*6.[IU] via INTRAVENOUS
  Filled 2020-06-16: qty 5

## 2020-06-16 MED ORDER — OXYTOCIN BOLUS FROM INFUSION
333.0000 mL | Freq: Once | INTRAVENOUS | Status: AC
  Administered 2020-06-16: 333 mL via INTRAVENOUS

## 2020-06-16 MED ORDER — LIDOCAINE HCL (PF) 1 % IJ SOLN
INTRAMUSCULAR | Status: DC | PRN
  Administered 2020-06-16: 10 mL via EPIDURAL
  Administered 2020-06-16: 2 mL via EPIDURAL

## 2020-06-16 NOTE — MAU Note (Signed)
..  Frances [name] is a 20 y.o. at [redacted]w[redacted]d returns to MAU reporting increased contractions since her visit earlier and are now every 2-3 minutes.. Denies vaginal bleeding or LOF. +FM

## 2020-06-16 NOTE — Anesthesia Postprocedure Evaluation (Signed)
Anesthesia Post Note  Patient: Frances [name]  Procedure(s) Performed: AN AD HOC LABOR EPIDURAL     Patient location during evaluation: Mother Baby Anesthesia Type: Epidural Level of consciousness: awake and alert Pain management: pain level controlled Vital Signs Assessment: post-procedure vital signs reviewed and stable Respiratory status: spontaneous breathing, nonlabored ventilation and respiratory function stable Cardiovascular status: stable Postop Assessment: no headache, no backache and epidural receding Anesthetic complications: no   No complications documented.  Last Vitals:  Vitals:   06/16/20 0831 06/16/20 1004  BP: 94/73 107/65  Pulse: 85 82  Resp: 17 16  Temp:  36.8 C  SpO2:  100%    Last Pain:  Vitals:   06/16/20 1004  TempSrc: Oral  PainSc: 0-No pain   Pain Goal: Patients Stated Pain Goal: 0 (06/16/20 0320)                 Fanny Dance

## 2020-06-16 NOTE — H&P (Addendum)
OBSTETRIC ADMISSION HISTORY AND PHYSICAL  Frances [name] is a 20 y.o. female G2P1001 with IUP at [redacted]w[redacted]d by 10 week [place] presenting for SOL. She reports +FMs, No LOF, no VB, no blurry vision, headaches or peripheral edema, and RUQ pain.  She plans on bottle feeding. She request Nexplanon for birth control. She received her [facility]   Dating: By 10 week [place] --->  Estimated Date of Delivery: 07/12/20  Sono:    @[redacted]w[redacted]d , CWD, normal anatomy, cephalic presentation, fundal lie, 440g, 78% EFW   Prenatal History/Complications: HSV-  On suppression Short interval between pregnancies History of depression, not on meds   Past Medical History: Past Medical History:  Diagnosis Date  . ADHD (attention deficit hyperactivity disorder)   . Allergy   . Depressed   . ODD (oppositional defiant disorder)   . Post traumatic stress disorder   . PTSD (post-traumatic stress disorder)   . Seizures (HCC)     Past Surgical History: Past Surgical History:  Procedure Laterality Date  . APPENDECTOMY    . LAPAROSCOPIC APPENDECTOMY N/A 01/18/2013   Procedure: APPENDECTOMY LAPAROSCOPIC;  Surgeon: 01/20/2013. Judie Petit, MD;  Location: [facility] OR;  Service: Pediatrics;  Laterality: N/A;  . TONSILLECTOMY      Obstetrical History: OB History    Gravida  2   Para  1   Term  1   Preterm      AB      Living  1     SAB      IAB      Ectopic      Multiple      Live Births  1           Social History Social History   Socioeconomic History  . Marital status: Married    Spouse name: Not on file  . Number of children: Not on file  . Years of education: Not on file  . Highest education level: Not on file  Occupational History  . 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 Smoking status: Passive Smoke Exposure - Never Smoker  . Smokeless tobacco: Never Used  Vaping Use  . Vaping Use: Never used  Substance and Sexual Activity  . Alcohol  use: No  . Drug use: No  . Sexual activity: Yes    Birth control/protection: None  Other Topics Concern  . Not on file  Social History Narrative  . Not on file   Social Determinants of Health   Financial Resource Strain: Low Risk   . Difficulty of Paying Living Expenses: Not very hard  Food Insecurity: No Food Insecurity  . Worried About Consulting civil engineer in the Last Year: Never true  . Ran Out of Food in the Last Year: Never true  Transportation Needs: No Transportation Needs  . Lack of Transportation (Medical): No  . Lack of Transportation (Non-Medical): No  Physical Activity: Inactive  . Days of Exercise per Week: 0 days  . Minutes of Exercise per Session: 0 min  Stress: Stress Concern Present  . Feeling of Stress : To some extent  Social Connections: Moderately Integrated  . Frequency of Communication with Friends and Family: More than three times a week  . Frequency of Social Gatherings with Friends and Family: Twice a week  . Attends Religious Services: 1 to 4 times per year  . Active Member of Clubs or Organizations: No  . Attends Programme researcher, broadcasting/film/video  Meetings: Never  . Marital Status: Married    Family History: Family History  Problem Relation Age of Onset  . Cancer Maternal Grandmother   . Heart disease Maternal Grandmother   . Cancer Maternal Grandfather   . Diabetes Maternal Grandfather   . Heart disease Maternal Grandfather   . Cancer Paternal Grandmother   . Heart disease Paternal Grandmother   . Cancer Paternal Grandfather   . Heart disease Paternal Grandfather     Allergies: Allergies  Allergen Reactions  . Risperidone And Related Hives  . Shellfish Allergy Hives, Nausea And Vomiting and Swelling    Medications Prior to Admission  Medication Sig Dispense Refill Last Dose  . acyclovir (ZOVIRAX) 400 MG tablet Take 1 tablet (400 mg total) by mouth 3 (three) times daily. 90 tablet 2   . Prenatal Vit-Fe Fumarate-FA (PRENATAL VITAMIN PO) Take by  mouth. (Patient not taking: No sig reported)        Review of Systems   All systems reviewed and negative except as stated in HPI  Blood pressure 110/79, pulse (!) 106, height 5\' 2"  (1.575 m), weight 72.9 kg, last menstrual period 09/26/2019, SpO2 97 %. General appearance: distracted and no distress Pelvic: No lesions visualized Presentation: cephalic Fetal monitoringBaseline: 125 bpm, Variability: Good {> 6 bpm), Accelerations: Reactive and Decelerations: Variable: mild Uterine activityFrequency: Every 3 minutes Dilation: 4 Effacement (%): 80 Station: -1 Exam by:: [email or web address], RN   Prenatal labs: ABO, Rh: A/Positive/-- (10/27 1541) Antibody: Negative (02/21 0910) Rubella: 5.46 (10/27 1541) RPR: Non Reactive (02/21 0910)  HBsAg: Negative (10/27 1541)  HIV: Non Reactive (02/21 0910)  GBS:   Unknown 1 hr Glucola Normal Genetic screening Normal Anatomy 09[birth year]-1997 Normal  Prenatal Transfer Tool  Maternal Diabetes: No Genetic Screening: Normal Maternal Ultrasounds/Referrals: Normal Fetal Ultrasounds or other Referrals:  None Maternal Substance Abuse:  No Significant Maternal Medications:  Acyclovir Significant Maternal Lab Results: GBS Unknown  No results found for this or any previous visit (from the past 24 hour(s)).  Patient Active Problem List   Diagnosis Date Noted  . HSV-2 infection 12/29/2019  . Encounter for supervision of normal pregnancy, antepartum 12/29/2019  . Short interval between pregnancies affecting pregnancy, antepartum 12/29/2019  . Severe recurrent major depression without psychotic features (HCC) 05/24/2018  . ADHD (attention deficit hyperactivity disorder), combined type 07/30/2013  . Insomnia 07/30/2013  . Nocturia 07/30/2013  . Oppositional defiant disorder 04/14/2011    Assessment/Plan:  Frances [name] is a 20 y.o. G2P1001 at 108w2d here for PTL.  #Labor: Expectant Management.   #Pain: Epidural. #FWB: Category 2 due to variables, reassuring  2/2 mod variability  #ID: GBS unknown, treating with Penicillin #MOF: Bottle #MOC: Nexplanon #Circ: Yes #HSV: Prophylactically treated with Acyclovir.  No lesions visualized on exam.  [redacted]w[redacted]d, MD  06/16/2020, 2:14 AM  I saw and evaluated the patient. I agree with the findings and the plan of care as documented in the resident's note.  06/18/2020, MD [facility] Family Medicine Fellow, [facility] for [facility], [facility]

## 2020-06-16 NOTE — Anesthesia Procedure Notes (Signed)
Epidural Patient location during procedure: OB Start time: 06/16/2020 3:36 AM End time: 06/16/2020 3:46 AM  Staffing Anesthesiologist: Lannie Fields, DO Performed: anesthesiologist   Preanesthetic Checklist Completed: patient identified, IV checked, risks and benefits discussed, monitors and equipment checked, pre-op evaluation and timeout performed  Epidural Patient position: sitting Prep: DuraPrep and site prepped and draped Patient monitoring: continuous pulse ox, blood pressure, heart rate and cardiac monitor Approach: midline Location: L3-L4 Injection technique: LOR air  Needle:  Needle type: Tuohy  Needle gauge: 17 G Needle length: 9 cm Needle insertion depth: 7 cm Catheter type: closed end flexible Catheter size: 19 Gauge Catheter at skin depth: 12 cm Test dose: negative  Assessment Sensory level: T8 Events: blood not aspirated, injection not painful, no injection resistance, no paresthesia and negative IV test  Additional Notes Patient identified. Risks/Benefits/Options discussed with patient including but not limited to bleeding, infection, nerve damage, paralysis, failed block, incomplete pain control, headache, blood pressure changes, nausea, vomiting, reactions to medication both or allergic, itching and postpartum back pain. Confirmed with bedside nurse the patient's most recent platelet count. Confirmed with patient that they are not currently taking any anticoagulation, have any bleeding history or any family history of bleeding disorders. Patient expressed understanding and wished to proceed. All questions were answered. Sterile technique was used throughout the entire procedure. Please see nursing notes for vital signs. Test dose was given through epidural catheter and negative prior to continuing to dose epidural or start infusion. Warning signs of high block given to the patient including shortness of breath, tingling/numbness in hands, complete motor  block, or any concerning symptoms with instructions to call for help. Patient was given instructions on fall risk and not to get out of bed. All questions and concerns addressed with instructions to call with any issues or inadequate analgesia.  Reason for block:procedure for pain

## 2020-06-16 NOTE — Anesthesia Preprocedure Evaluation (Signed)
Anesthesia Evaluation  Patient identified by MRN, date of birth, ID band Patient awake    Reviewed: Allergy & Precautions, Patient's Chart, lab work & pertinent test results  Airway Mallampati: II  TM Distance: >3 FB Neck ROM: Full    Dental no notable dental hx.    Pulmonary neg pulmonary ROS,    Pulmonary exam normal breath sounds clear to auscultation       Cardiovascular negative cardio ROS Normal cardiovascular exam Rhythm:Regular Rate:Normal     Neuro/Psych PSYCHIATRIC DISORDERS Anxiety Depression negative neurological ROS     GI/Hepatic negative GI ROS, Neg liver ROS,   Endo/Other  negative endocrine ROS  Renal/GU negative Renal ROS  negative genitourinary   Musculoskeletal negative musculoskeletal ROS (+)   Abdominal   Peds negative pediatric ROS (+)  Hematology  (+) Blood dyscrasia, anemia , hct 28.9, plt 260   Anesthesia Other Findings   Reproductive/Obstetrics (+) Pregnancy                             Anesthesia Physical Anesthesia Plan  ASA: II and emergent  Anesthesia Plan: Epidural   Post-op Pain Management:    Induction:   PONV Risk Score and Plan: 2  Airway Management Planned: Natural Airway  Additional Equipment: None  Intra-op Plan:   Post-operative Plan:   Informed Consent: I have reviewed the patients History and Physical, chart, labs and discussed the procedure including the risks, benefits and alternatives for the proposed anesthesia with the patient or authorized representative who has indicated his/her understanding and acceptance.       Plan Discussed with:   Anesthesia Plan Comments:         Anesthesia Quick Evaluation

## 2020-06-16 NOTE — Discharge Summary (Signed)
Postpartum Discharge Summary     Patient Name: Frances [name] DOB: August 25, 2000 MRN: [id number]  Date of admission: 06/16/2020 Delivery date:06/16/2020  Delivering provider: Janet Berlin  Date of discharge: 06/18/2020  Admitting diagnosis: Normal labor and delivery [O80] Intrauterine pregnancy: [redacted]w[redacted]d     Secondary diagnosis:  Active Problems:   Severe recurrent major depression without psychotic features ([place])   Normal labor and delivery   Vaginal delivery  Additional problems: none    Discharge diagnosis: Preterm Pregnancy Delivered                                              Post partum procedures:nexplanon Augmentation: N/A Complications: None  Hospital course: Onset of Labor With Vaginal Delivery      20 y.o. yo G2P1001 at [redacted]w[redacted]d was admitted in Latent Labor on 06/16/2020. Patient had an uncomplicated labor course as follows:  Membrane Rupture Time/Date: 6:42 AM ,06/16/2020   Delivery Method:Vaginal, Spontaneous  Episiotomy: None  Lacerations:  None  Patient had an uncomplicated postpartum course.  She is ambulating, tolerating a regular diet, passing flatus, and urinating well. Patient is discharged home in stable condition on 06/18/20.  Newborn Data: Birth date:06/16/2020  Birth time:7:12 AM  Gender:Female  Living status:Living  Apgars:8 ,8  Weight:2690 g   Magnesium Sulfate received: No BMZ received: No Rhophylac:N/A MMR:N/A T-DaP:Given prenatally Flu: No Transfusion:No  Physical exam  Vitals:   06/17/20 0711 06/17/20 1417 06/17/20 2024 06/18/20 0551  BP: 110/70 106/72 108/69 109/80  Pulse: 81 85 86 84  Resp: $Remo'17 15 18 16  'GFffK$ Temp: 97.9 F (36.6 C) 98.2 F (36.8 C) 98.4 F (36.9 C) 98.4 F (36.9 C)  TempSrc: Oral Oral Oral Oral  SpO2: 99% 99% 99% 99%  Weight:      Height:       General: alert Lochia: appropriate Uterine Fundus: firm Incision: N/A DVT Evaluation: No evidence of DVT seen on physical exam. Labs: Lab Results  Component Value Date    WBC 16.4 (H) 06/16/2020   HGB 9.4 (L) 06/16/2020   HCT 28.9 (L) 06/16/2020   MCV 77.5 (L) 06/16/2020   PLT 260 06/16/2020   CMP Latest Ref Rng & Units 08/13/2018  Glucose 70 - 99 mg/dL 98  BUN 6 - 20 mg/dL 9  Creatinine 0.44 - 1.00 mg/dL 0.64  Sodium 135 - 145 mmol/L 137  Potassium 3.5 - 5.1 mmol/L 3.7  Chloride 98 - 111 mmol/L 106  CO2 22 - 32 mmol/L 20(L)  Calcium 8.9 - 10.3 mg/dL 9.3  Total Protein 6.5 - 8.1 g/dL -  Total Bilirubin 0.3 - 1.2 mg/dL -  Alkaline Phos 38 - 126 U/L -  AST 15 - 41 U/L -  ALT 0 - 44 U/L -   Edinburgh Score: Edinburgh Postnatal Depression Scale Screening Tool 06/16/2020  I have been able to laugh and see the funny side of things. 0  I have looked forward with enjoyment to things. 0  I have blamed myself unnecessarily when things went wrong. 0  I have been anxious or worried for no good reason. 0  I have felt scared or panicky for no good reason. 0  Things have been getting on top of me. 1  I have been so unhappy that I have had difficulty sleeping. 0  I have felt sad or miserable. 0  I have  been so unhappy that I have been crying. 0  The thought of harming myself has occurred to me. 0  Edinburgh Postnatal Depression Scale Total 1     After visit meds:  Allergies as of 06/18/2020      Reactions   Risperidone And Related Hives   Shellfish Allergy Hives, Nausea And Vomiting, Swelling      Medication List    STOP taking these medications   acyclovir 400 MG tablet Commonly known as: ZOVIRAX     TAKE these medications   acetaminophen 325 MG tablet Commonly known as: Tylenol Take 2 tablets (650 mg total) by mouth every 4 (four) hours as needed (for pain scale < 4).   docusate sodium 100 MG capsule Commonly known as: COLACE Take 1 capsule (100 mg total) by mouth 2 (two) times daily.   ibuprofen 600 MG tablet Commonly known as: ADVIL Take 1 tablet (600 mg total) by mouth every 6 (six) hours.   PRENATAL VITAMIN PO Take by mouth.         Discharge home in stable condition Infant Feeding: Bottle Infant Disposition:home with mother Discharge instruction: per After Visit Summary and Postpartum booklet. Activity: Advance as tolerated. Pelvic rest for 6 weeks.  Diet: routine diet Future Appointments: No future appointments. Follow up Visit:   Please schedule this patient for a In person postpartum visit in 6 weeks with the following provider: Any provider. Additional Postpartum F/U:Postpartum Depression checkup 2 weeks Low risk pregnancy complicated by: none Delivery mode:  Vaginal, Spontaneous  Anticipated Birth Control:  Nexplanon   06/18/2020 Janet Berlin, MD

## 2020-06-17 NOTE — Progress Notes (Signed)
Post Partum Day 1 Subjective: no complaints, up ad lib, voiding and tolerating PO, small lochia, plans to bottle feed, outpt nexplanon  Objective: Blood pressure 110/70, pulse 81, temperature 97.9 F (36.6 C), temperature source Oral, resp. rate 17, height 5\' 2"  (1.575 m), weight 72.9 kg, last menstrual period 09/26/2019, SpO2 99 %, unknown if currently breastfeeding.  Physical Exam:  General: alert, cooperative and no distress Lochia:normal flow Chest: CTAB Heart: RRR no m/r/g Abdomen: +BS, soft, nontender,  Uterine Fundus: firm DVT Evaluation: No evidence of DVT seen on physical exam. Extremities: trace edema  Recent Labs    06/16/20 0226  HGB 9.4*  HCT 28.9*    Assessment/Plan: Plan for discharge tomorrow   LOS: 1 day   06/18/20 06/17/2020, 8:07 AM

## 2020-06-17 NOTE — Social Work (Signed)
CSW received consult for hx of Anxiety, Depression, PTSD and ADHD.  CSW met with MOB to offer support and complete assessment.     CSW introduced self and role. CSW observed FOB in recliner, feeding infant. CSW offered to return to speak with MOB in private. MOB declined and stated FOB could remain in room for assessment. MOB was engaged and pleasant throughout assessment. FOB was observed interacting appropriately with infant. CSW informed MOB of reason for consult and assessed current mood. MOB reported she is currently doing well and stated she had a "really long" pregnancy, as she laughed. MOB disclosed she was diagnosed with PTSD, ADHD, depression and anxiety at age 8. MOB stated she was on medication in 2020 following a hospitalization at [facility], which helped with anxiety, depression and sleep. MOB expressed it was helpful at the time. MOB reported she discontinued the medication during her last pregnancy in 2020. MOB stated she is unsure if she will restart postpartum, considering she has been stable, but that she has access to her prescribing provider if necessary. MOB reported she also attended some therapy in 2020, following the hospitalization. CSW asked MOB if she has had any SI since the hospitalization. MOB stated "no, and I didn't even have any back then. It was just a lot of stuff going on." MOB identified FOB, her mother-in-law and mom as great supports. MOB denies any current SI or HI.    CSW provided education regarding the baby blues period versus PPD and offered resources, which MOB declined. CSW provided the New Mom Checklist and encouraged MOB to self evaluate and contact a medical professional if symptoms are noted at any time.  CSW provided review of Sudden Infant Death Syndrome (SIDS) precautions. MOB reported infant will sleep in bassinet and that she has everything needed for infant. MOB identified [facility] for follow-up care and denies any barriers. MOB denies having any  additional questions or needs at this time.   CSW identifies no further need for intervention and no barriers to discharge at this time.  Pharrell Ledford, LCSWA Clinical Social Work [facility] [phone] 

## 2020-06-18 DIAGNOSIS — Z30017 Encounter for initial prescription of implantable subdermal contraceptive: Secondary | ICD-10-CM

## 2020-06-18 LAB — CULTURE, BETA STREP (GROUP B ONLY): Strep Gp B Culture: NEGATIVE

## 2020-06-18 MED ORDER — ACETAMINOPHEN 325 MG PO TABS: 650.0000 mg | ORAL_TABLET | ORAL | 0 refills | Status: DC | PRN

## 2020-06-18 MED ORDER — LIDOCAINE HCL 1 % IJ SOLN
0.0000 mL | Freq: Once | INTRAMUSCULAR | Status: AC | PRN
  Administered 2020-06-18: 20 mL via INTRADERMAL
  Filled 2020-06-18: qty 20

## 2020-06-18 MED ORDER — DOCUSATE SODIUM 100 MG PO CAPS: 100.0000 mg | ORAL_CAPSULE | Freq: Two times a day (BID) | ORAL | 0 refills | Status: DC

## 2020-06-18 MED ORDER — ETONOGESTREL 68 MG ~~LOC~~ IMPL
68.0000 mg | DRUG_IMPLANT | Freq: Once | SUBCUTANEOUS | Status: AC
  Administered 2020-06-18: 68 mg via SUBCUTANEOUS
  Filled 2020-06-18: qty 1

## 2020-06-18 MED ORDER — IBUPROFEN 600 MG PO TABS: 600.0000 mg | ORAL_TABLET | Freq: Four times a day (QID) | ORAL | 0 refills | Status: DC

## 2020-06-18 NOTE — Progress Notes (Signed)
  NEXPLANON INSERTION Patient name: Frances [name] MRN [id number]  Date of birth: 2000/03/31 Subjective Findings:   Frances [name] is a 20 y.o. [id number] desires postpartum Nexplanon insertion for contraception.  Risks/benefits/side effects of Nexplanon have been discussed and her questions have been answered.  She is aware of the common side effect of irregular bleeding, which the incidence of decreases over time. Signed copy of informed consent in chart.  Objective Findings & Procedure:    Vitals:   06/17/20 0711 06/17/20 1417 06/17/20 2024 06/18/20 0551  BP: 110/70 106/72 108/69 109/80  Pulse: 81 85 86 84  Resp: 17 15 18 16   Temp: 97.9 F (36.6 C) 98.2 F (36.8 C) 98.4 F (36.9 C) 98.4 F (36.9 C)  TempSrc: Oral Oral Oral Oral  SpO2: 99% 99% 99% 99%  Weight:      Height:      Body mass index is 29.39 kg/m.  Time out was performed.  She is right-handed, so her left arm, approximately 10cm from the medial epicondyle and 3-5cm posterior to the sulcus, was cleansed with alcohol and anesthetized with 2cc of 2% Lidocaine.  The area was cleansed again with betadine and the Nexplanon was inserted per manufacturer's recommendations without difficulty.  3 steri-strips and pressure bandage were applied. The patient tolerated the procedure well.  Assessment & Plan:   1) Nexplanon insertion Pt was instructed to keep the area clean and dry, remove pressure bandage in 24 hours, and keep insertion site covered with the steri-strip for 3-5 days.  B She was given a card indicating date Nexplanon was inserted and date it needs to be removed. Follow-up PRN problems.  , DO Attending Obstetrician & Gynecologist, [facility] for [facility]

## 2020-06-21 ENCOUNTER — Other Ambulatory Visit: Payer: Self-pay

## 2020-06-21 ENCOUNTER — Ambulatory Visit (INDEPENDENT_AMBULATORY_CARE_PROVIDER_SITE_OTHER): Payer: 59 | Admitting: Obstetrics & Gynecology

## 2020-06-21 ENCOUNTER — Encounter: Payer: 59 | Admitting: Women's Health

## 2020-06-21 ENCOUNTER — Encounter: Payer: Self-pay | Admitting: Obstetrics & Gynecology

## 2020-06-21 VITALS — BP 120/81 | HR 76 | Temp 98.2°F | Ht 62.0 in | Wt 151.0 lb

## 2020-06-21 DIAGNOSIS — N6459 Other signs and symptoms in breast: Secondary | ICD-10-CM

## 2020-06-21 NOTE — Progress Notes (Signed)
Chief Complaint  Patient presents with  . ? mastitis      20 y.o. [id number] Patient's last menstrual period was 09/26/2019. The current method of family planning is just delivered.  Outpatient Encounter Medications as of 06/21/2020  Medication Sig  . acetaminophen (TYLENOL) 325 MG tablet Take 2 tablets (650 mg total) by mouth every 4 (four) hours as needed (for pain scale < 4).  . docusate sodium (COLACE) 100 MG capsule Take 1 capsule (100 mg total) by mouth 2 (two) times daily. (Patient not taking: Reported on 06/21/2020)  . ibuprofen (ADVIL) 600 MG tablet Take 1 tablet (600 mg total) by mouth every 6 (six) hours. (Patient not taking: Reported on 06/21/2020)  . [DISCONTINUED] Prenatal Vit-Fe Fumarate-FA (PRENATAL VITAMIN PO) Take by mouth. (Patient not taking: No sig reported)   No facility-administered encounter medications on file as of 06/21/2020.    Subjective Pt in with concern for mastitis Not breast feeding PPD #5 Began yesterday bilateral Past Medical History:  Diagnosis Date  . ADHD (attention deficit hyperactivity disorder)   . Allergy   . Depressed   . ODD (oppositional defiant disorder)   . Post traumatic stress disorder   . PTSD (post-traumatic stress disorder)     Past Surgical History:  Procedure Laterality Date  . APPENDECTOMY    . LAPAROSCOPIC APPENDECTOMY N/A 01/18/2013   Procedure: APPENDECTOMY LAPAROSCOPIC;  Surgeon: Judie Petit. Leonia Corona, MD;  Location: [facility] OR;  Service: Pediatrics;  Laterality: N/A;  . TONSILLECTOMY      OB History    Gravida  2   Para  2   Term  1   Preterm  1   AB      Living  2     SAB      IAB      Ectopic      Multiple  0   Live Births  2           Allergies  Allergen Reactions  . Risperidone And Related Hives  . Shellfish Allergy Hives, Nausea And Vomiting and Swelling    Social History   Socioeconomic History  . Marital status: Married    Spouse name: Ieshia Hatcher  . Number of  children: 2  . Years of education: Not on file  . Highest education level: Not on file  Occupational History  . Not on file  Tobacco Use  . Smoking status: Passive Smoke Exposure - Never Smoker  . Smokeless tobacco: Never Used  Vaping Use  . Vaping Use: Never used  Substance and Sexual Activity  . Alcohol use: No  . Drug use: No  . Sexual activity: Not Currently    Birth control/protection: Implant  Other Topics Concern  . Not on file  Social History Narrative  . Not on file   Social Determinants of Health   Financial Resource Strain: Low Risk   . Difficulty of Paying Living Expenses: Not very hard  Food Insecurity: No Food Insecurity  . Worried About Programme researcher, broadcasting/film/video in the Last Year: Never true  . Ran Out of Food in the Last Year: Never true  Transportation Needs: No Transportation Needs  . Lack of Transportation (Medical): No  . Lack of Transportation (Non-Medical): No  Physical Activity: Inactive  . Days of Exercise per Week: 0 days  . Minutes of Exercise per Session: 0 min  Stress: Stress Concern Present  . Feeling of Stress : To some extent  Social Connections:  Moderately Integrated  . Frequency of Communication with Friends and Family: More than three times a week  . Frequency of Social Gatherings with Friends and Family: Twice a week  . Attends Religious Services: 1 to 4 times per year  . Active Member of Clubs or Organizations: No  . Attends Banker Meetings: Never  . Marital Status: Married    Family History  Problem Relation Age of Onset  . Cancer Maternal Grandmother   . Heart disease Maternal Grandmother   . Cancer Maternal Grandfather   . Diabetes Maternal Grandfather   . Heart disease Maternal Grandfather   . Cancer Paternal Grandmother   . Heart disease Paternal Grandmother   . Cancer Paternal Grandfather   . Heart disease Paternal Grandfather     Medications:       Current Outpatient Medications:  .  acetaminophen (TYLENOL)  325 MG tablet, Take 2 tablets (650 mg total) by mouth every 4 (four) hours as needed (for pain scale < 4)., Disp: 30 tablet, Rfl: 0 .  docusate sodium (COLACE) 100 MG capsule, Take 1 capsule (100 mg total) by mouth 2 (two) times daily. (Patient not taking: Reported on 06/21/2020), Disp: 10 capsule, Rfl: 0 .  ibuprofen (ADVIL) 600 MG tablet, Take 1 tablet (600 mg total) by mouth every 6 (six) hours. (Patient not taking: Reported on 06/21/2020), Disp: 30 tablet, Rfl: 0  Objective Blood pressure 120/81, pulse 76, temperature 98.2 F (36.8 C), height 5\' 2"  (1.575 m), weight 151 lb (68.5 kg), last menstrual period 09/26/2019, not currently breastfeeding.  Global engorgment, o erythema or segments that are painful or hot Not mastitis bilateral engorgement  Pertinent ROS   Labs or studies     Impression Diagnoses this Encounter::   ICD-10-CM   1. Breast engorgement  N64.59     Established relevant diagnosis(es):   Plan/Recommendations: No orders of the defined types were placed in this encounter.   Labs or Scans Ordered: No orders of the defined types were placed in this encounter.   Management:: No stimulation Ice packs Tight bra + ace wrap  Follow up   As scheduled       All questions were answered.

## 2020-06-29 ENCOUNTER — Ambulatory Visit (INDEPENDENT_AMBULATORY_CARE_PROVIDER_SITE_OTHER): Payer: 59 | Admitting: Advanced Practice Midwife

## 2020-06-29 ENCOUNTER — Encounter: Payer: Self-pay | Admitting: Advanced Practice Midwife

## 2020-06-29 ENCOUNTER — Other Ambulatory Visit: Payer: Self-pay

## 2020-06-29 VITALS — BP 116/81 | HR 64 | Ht 62.0 in | Wt 146.0 lb

## 2020-06-29 DIAGNOSIS — Z8659 Personal history of other mental and behavioral disorders: Secondary | ICD-10-CM

## 2020-06-29 DIAGNOSIS — Z8759 Personal history of other complications of pregnancy, childbirth and the puerperium: Secondary | ICD-10-CM

## 2020-06-29 NOTE — Progress Notes (Signed)
   GYN VISIT Patient name: Frances [name] MRN [id number]  Date of birth: 2000-07-30 Chief Complaint:   Postpartum Care  History of Present Illness:   Frances [name] is a 20 y.o. G23P1102 Caucasian female being seen today for mood check after vag del on 06/16/20. She reports feeling well, no s/s depression; baby is doing well.     Depression screen [facility] 2/9 04/25/2020 12/30/2019  Decreased Interest 1 2  Down, Depressed, Hopeless 0 2  PHQ - 2 Score 1 4  Altered sleeping 1 2  Tired, decreased energy 1 2  Change in appetite 1 2  Feeling bad or failure about yourself  0 0  Trouble concentrating 0 0  Moving slowly or fidgety/restless 0 2  Suicidal thoughts 0 0  PHQ-9 Score 4 12    Patient's last menstrual period was 09/26/2019. The current method of family planning is Nexplanon.  Last pap <21yo.  Review of Systems:   Pertinent items are noted in HPI Denies fever/chills, dizziness, headaches, visual disturbances, fatigue, shortness of breath, chest pain, abdominal pain, vomiting, abnormal vaginal discharge/itching/odor/irritation, problems with periods, bowel movements, urination, or intercourse unless otherwise stated above.  Pertinent History Reviewed:  Reviewed past medical,surgical, social, obstetrical and family history.  Reviewed problem list, medications and allergies. Physical Assessment:   Vitals:   06/29/20 1356  BP: 116/81  Pulse: 64  Weight: 146 lb (66.2 kg)  Height: 5\' 2"  (1.575 m)  Body mass index is 26.7 kg/m.       Physical Examination:   General appearance: alert, well appearing, and in no distress  Mental status: alert, oriented to person, place, and time  Skin: warm & dry   Cardiovascular: normal heart rate noted  Respiratory: normal respiratory effort, no distress  Abdomen: soft, non-tender   Pelvic: examination not indicated  Extremities: no edema    No results found for this or any previous visit (from the past 24 hour(s)).  Assessment & Plan:  1)  Postpartum mood check> doing well!; no concerns w depression; keep PP visit as scheduled   Meds: No orders of the defined types were placed in this encounter.   No orders of the defined types were placed in this encounter.   Return for As scheduled.  CNM 06/29/2020 2:36 PM

## 2020-06-29 NOTE — Patient Instructions (Signed)
Postpartum Baby Blues The postpartum period begins right after the birth of a baby. During this time, there is often joy and excitement. It is also a time of many changes in the life of the parents. A mother may feel happy one minute and sad or stressed the next. These feelings of sadness, called the baby blues, usually happen in the period right after the baby is born and go away within a week or two. What are the causes? The exact cause of this condition is not known. Changes in hormone levels after childbirth are believed to trigger some of the symptoms. Other factors that can play a role in these mood changes include:  Lack of sleep.  Stressful life events, such as financial problems, caring for a loved one, or death of a loved one.  Genetics. What are the signs or symptoms? Symptoms of this condition include:  Changes in mood, such as going from extreme happiness to sadness.  A decrease in concentration.  Difficulty sleeping.  Crying spells and tearfulness.  Loss of appetite.  Irritability.  Anxiety. If these symptoms last for more than 2 weeks or become more severe, you may have postpartum depression. How is this diagnosed? This condition is diagnosed based on an evaluation of your symptoms. Your health care provider may use a screening tool that includes a list of questions to help identify a person with the baby blues or postpartum depression. How is this treated? The baby blues usually go away on their own in 1-2 weeks. Social support is often what is needed. You will be encouraged to get adequate sleep and rest. Follow these instructions at home: Lifestyle  Get as much rest as you can. Take a nap when the baby sleeps.  Exercise regularly as told by your health care provider. Some women find yoga and walking to be helpful.  Eat a balanced and nourishing diet. This includes plenty of fruits and vegetables, whole grains, and lean proteins.  Do little things that you  enjoy. Take a bubble bath, read your favorite magazine, or listen to your favorite music.  Avoid alcohol.  Ask for help with household chores, cooking, grocery shopping, or running errands. Do not try to do everything yourself. Consider hiring a postpartum doula to help. This is a professional who specializes in providing support to new mothers.  Try not to make any major life changes during pregnancy or right after giving birth. This can add stress.      General instructions  Talk to people close to you about how you are feeling. Get support from your partner, family members, friends, or other new moms. You may want to join a support group.  Find ways to manage stress. This may include: ? Writing your thoughts and feelings in a journal. ? Spending time outside. ? Spending time with people who make you laugh.  Try to stay positive in how you think. Think about the things you are grateful for.  Take over-the-counter and prescription medicines only as told by your health care provider.  Let your health care provider know if you have any concerns.  Keep all postpartum visits. This is important. Contact a health care provider if:  Your baby blues do not go away after 2 weeks. Get help right away if:  You have thoughts of taking your own life (suicidal thoughts), or of harming your baby or someone else.  You see or hear things that are not there (hallucinations). If you ever feel like you   may hurt yourself or others, or have thoughts about taking your own life, get help right away. Go to your nearest emergency department or:  Call your local emergency services (911 in the [place]  Call a suicide crisis helpline, such as the National Suicide Prevention Lifeline, at 1-800-273-8255. This is open 24 hours a day in the [place]  Text the Crisis Text Line at 741741 (in the [place] Summary  After giving birth, you may feel happy one minute and sad or stressed the next. Feelings of sadness  that happen right after the baby is born and go away after a week or two are called the baby blues.  You can manage the baby blues by getting enough rest, eating a healthy diet, exercising, spending time with supportive people, and finding ways to manage stress.  If feelings of sadness and stress last longer than 2 weeks or get in the way of caring for your baby, talk with your health care provider. This may mean you have postpartum depression. This information is not intended to replace advice given to you by your health care provider. Make sure you discuss any questions you have with your health care provider. Document Revised: 08/14/2019 Document Reviewed: 08/14/2019 Elsevier Patient Education  2021 Elsevier Inc.  

## 2020-07-21 ENCOUNTER — Encounter: Payer: Self-pay | Admitting: Advanced Practice Midwife

## 2020-07-21 ENCOUNTER — Other Ambulatory Visit: Payer: Self-pay

## 2020-07-21 ENCOUNTER — Ambulatory Visit (INDEPENDENT_AMBULATORY_CARE_PROVIDER_SITE_OTHER): Payer: 59 | Admitting: Advanced Practice Midwife

## 2020-07-21 NOTE — Progress Notes (Signed)
Post Partum Visit Note   Chief Complaint:   Postpartum Care  History of Present Illness:   Frances [name] is a 20 y.o. G35P1102 Caucasian female being seen today for a postpartum visit. She is 5 weeks postpartum following a spontaneous vaginal delivery at 36.2 gestational weeks. IOL: No, . Anesthesia: epidural.  Laceration: none.  Complications: none. Inpatient contraception: yes Nexplanon.   Pregnancy uncomplicated. Tobacco use: no. Substance use disorder: no. Last pap smear: n/a  No LMP recorded. Patient has had an implant.  Postpartum course has been uncomplicated. Bleeding no bleeding. Bowel function is normal. Bladder function is normal. Urinary incontinence? No, fecal incontinence? No Patient is not sexually active. Last sexual activity: .  Desired contraception: PP Nexplanon placed. Patient does not want a pregnancy in the future.  Desired family size is 2 children.   Upstream - 07/21/20 1142      Pregnancy Intention Screening   Does the patient want to become pregnant in the next year? No    Does the patient's partner want to become pregnant in the next year? No    Would the patient like to discuss contraceptive options today? No      Contraception Wrap Up   Current Method Hormonal Implant    End Method Hormonal Implant    Contraception Counseling Provided Yes          The pregnancy intention screening data noted above was reviewed. Potential methods of contraception were discussed. The patient elected to proceed with Hormonal Implant.   Edinburgh Postpartum Depression Screening: Negative  Edinburgh Postnatal Depression Scale - 07/21/20 1141      Edinburgh Postnatal Depression Scale:  In the Past 7 Days   I have been able to laugh and see the funny side of things. 0    I have looked forward with enjoyment to things. 0    I have blamed myself unnecessarily when things went wrong. 0    I have been anxious or worried for no good reason. 0    I have felt scared or panicky  for no good reason. 0    Things have been getting on top of me. 0    I have been so unhappy that I have had difficulty sleeping. 0    I have felt sad or miserable. 0    I have been so unhappy that I have been crying. 0    The thought of harming myself has occurred to me. 0    Edinburgh Postnatal Depression Scale Total 0          Baby's course has been uncomplicated. Baby is feeding by bottle. Infant has a pediatrician/family doctor? Yes.  Childcare strategy if returning to work/school: n/a.  Pt has material needs met for her and baby: Yes.   Review of Systems:   Pertinent items are noted in HPI Denies Abnormal vaginal discharge w/ itching/odor/irritation, headaches, visual changes, shortness of breath, chest pain, abdominal pain, severe nausea/vomiting, or problems with urination or bowel movements. Pertinent History Reviewed:  Reviewed past medical,surgical, obstetrical and family history.  Reviewed problem list, medications and allergies. OB History  Gravida Para Term Preterm AB Living  $Remov'2 2 1 1   2  'qVGeKP$ SAB IAB Ectopic Multiple Live Births        0 2    # Outcome Date GA Lbr Len/2nd Weight Sex Delivery Anes PTL Lv  2 Preterm 06/16/20 [redacted]w[redacted]d 08:00 / 00:12 5 lb 14.9 oz (2.69 kg) M Vag-Spont EPI  LIV  1 Term 07/15/19 [redacted]w[redacted]d  6 lb 4 oz (2.835 kg) F Vag-Spont EPI N LIV   Physical Assessment:   Vitals:   07/21/20 1139  BP: 123/86  Pulse: 88  Weight: 149 lb (67.6 kg)  Height: $Remove'5\' 2"'zknvpFR$  (1.575 m)  Body mass index is 27.25 kg/m.  Objective:  Blood pressure 123/86, pulse 88, height $RemoveBe'5\' 2"'TMIoHZdVj$  (1.575 m), weight 149 lb (67.6 kg), not currently breastfeeding.  General:  alert, cooperative, and no distress   Breasts:  negative  Lungs: Normal respiratory effort  Heart:  regular rate and rhythm  Abdomen: soft, non-tender,    Vulva:  not evaluated  Vagina: not evaluated  Cervix:  normal  Corpus: Well involuted  Adnexa:  not evaluated  Rectal Exam: no hemorrhoids          No results found  for this or any previous visit (from the past 24 hour(s)).  Assessment & Plan:  1) Postpartum exam 2) 5 wks s/p spontaneous vaginal delivery 3) bottle feeding 4) Depression screening 5) Contraception in place  Essential components of care per ACOG recommendations:  1.  Mood and well being:  . If positive depression screen, discussed and plan developed.  . If using tobacco we discussed reduction/cessation and risk of relapse . If current substance abuse, we discussed and referral to local resources was offered.   2. Infant care and feeding:  . If breastfeeding, discussed returning to work, pumping, breastfeeding-associated pain, guidance regarding return to fertility while lactating if not using another method. If needed, patient was provided with a letter to be allowed to pump q 2-3hrs to support lactation in a private location with access to a refrigerator to store breastmilk.   . Recommended that all caregivers be immunized for flu, pertussis and other preventable communicable diseases . If pt does not have material needs met for her/baby, referred to local resources for help obtaining these.  3. Sexuality, contraception and birth spacing . Provided guidance regarding sexuality, management of dyspareunia, and resumption of intercourse . Discussed avoiding interpregnancy interval <43mths and recommended birth spacing of 18 months  4. Sleep and fatigue . Discussed coping options for fatigue and sleep disruption . Encouraged family/partner/community support of 4 hrs of uninterrupted sleep to help with mood and fatigue  5. Physical recovery  . If pt had a C/S, assessed incisional pain and providing guidance on normal vs prolonged recovery . If pt had a laceration, perineal healing and pain reviewed.  . If urinary or fecal incontinence, discussed management and referred to PT or uro/gyn if indicated  . Patient is safe to resume physical activity. Discussed attainment of healthy  weight.  6.  Chronic disease management . Discussed pregnancy complications if any, and their implications for future childbearing and long-term maternal health. . Review recommendations for prevention of recurrent pregnancy complications, such as 17 hydroxyprogesterone caproate to reduce risk for recurrent PTB not applicable, or aspirin to reduce risk of preeclampsia not applicable. . Pt had GDM: No. If yes, 2hr GTT scheduled: not applicable. . Reviewed medications and non-pregnant dosing including consideration of whether pt is breastfeeding using a reliable resource such as LactMed: not applicable . Referred for f/u w/ PCP or subspecialist providers as indicated: not applicable  7. Health maintenance . Mammogram at 21yo or earlier if indicated . Pap smears as indicated  Meds: No orders of the defined types were placed in this encounter.   Follow-up: No follow-ups on file.   No orders of the defined types were  placed in this encounter.      [place] for [employer] 07/21/2020 12:04 PM

## 2020-11-16 ENCOUNTER — Other Ambulatory Visit: Payer: Self-pay

## 2020-11-16 ENCOUNTER — Emergency Department (HOSPITAL_COMMUNITY)
Admission: EM | Admit: 2020-11-16 | Discharge: 2020-11-16 | Disposition: A | Discharge status: Home / Self Care | Payer: 59 | Attending: Emergency Medicine | Admitting: Emergency Medicine

## 2020-11-16 ENCOUNTER — Encounter (HOSPITAL_COMMUNITY): Payer: Self-pay | Admitting: Emergency Medicine

## 2020-11-16 DIAGNOSIS — R0602 Shortness of breath: Secondary | ICD-10-CM | POA: Diagnosis not present

## 2020-11-16 DIAGNOSIS — R067 Sneezing: Secondary | ICD-10-CM | POA: Insufficient documentation

## 2020-11-16 DIAGNOSIS — R0789 Other chest pain: Secondary | ICD-10-CM | POA: Diagnosis not present

## 2020-11-16 DIAGNOSIS — Z7722 Contact with and (suspected) exposure to environmental tobacco smoke (acute) (chronic): Secondary | ICD-10-CM | POA: Diagnosis not present

## 2020-11-16 NOTE — ED Triage Notes (Signed)
Pt c/o sob and sneezing x 2 days.

## 2020-11-16 NOTE — ED Provider Notes (Signed)
[facility] EMERGENCY DEPARTMENT Provider Note   CSN: [id number] Arrival date & time: 11/16/20  6720     History Chief Complaint  Patient presents with   Shortness of Breath    Frances [name] is a 20 y.o. female.  The history is provided by the patient.  Shortness of Breath Severity:  Moderate Onset quality:  Gradual Duration:  1 day Timing:  Constant Progression:  Unchanged Chronicity:  New Relieved by:  Nothing Worsened by:  Nothing Associated symptoms: no chest pain, no cough, no fever, no rash, no sore throat and no vomiting   Risk factors: no hx of PE/DVT and no oral contraceptive use   Patient with history of ADHD presents with shortness of breath and sneezing for the past day.  No cough or fever.  She reports mild chest tightness but no actual pain.  She reports some anxiety.  She reports she recently started vaping after she turned 20.  She does not typically smoke cigarettes.  She is not concerned for COVID-19.  She reports her husband asked her to go to the ER. She reports that it did feel like a previous allergy reaction, but no known exposures.  No tongue or lip swelling, no rash, no vomiting or diarrhea    Past Medical History:  Diagnosis Date   ADHD (attention deficit hyperactivity disorder)    Allergy    Depressed    ODD (oppositional defiant disorder)    Post traumatic stress disorder    PTSD (post-traumatic stress disorder)     Patient Active Problem List   Diagnosis Date Noted   Normal labor and delivery 06/16/2020   Vaginal delivery 06/16/2020   HSV-2 infection 12/29/2019   Severe recurrent major depression without psychotic features (HCC) 05/24/2018   ADHD (attention deficit hyperactivity disorder), combined type 07/30/2013   Insomnia 07/30/2013   Nocturia 07/30/2013   Oppositional defiant disorder 04/14/2011    Past Surgical History:  Procedure Laterality Date   APPENDECTOMY     LAPAROSCOPIC APPENDECTOMY N/A 01/18/2013   Procedure:  APPENDECTOMY LAPAROSCOPIC;  Surgeon: Judie Petit. Leonia Corona, MD;  Location: [facility] OR;  Service: Pediatrics;  Laterality: N/A;   TONSILLECTOMY       OB History     Gravida  2   Para  2   Term  1   Preterm  1   AB      Living  2      SAB      IAB      Ectopic      Multiple  0   Live Births  2           Family History  Problem Relation Age of Onset   Cancer Maternal Grandmother    Heart disease Maternal Grandmother    Cancer Maternal Grandfather    Diabetes Maternal Grandfather    Heart disease Maternal Grandfather    Cancer Paternal Grandmother    Heart disease Paternal Grandmother    Cancer Paternal Grandfather    Heart disease Paternal Grandfather     Social History   Tobacco Use   Smoking status: Never    Passive exposure: Yes   Smokeless tobacco: Never  Vaping Use   Vaping Use: Every day  Substance Use Topics   Alcohol use: Yes    Comment: occ   Drug use: No    Home Medications Prior to Admission medications   Medication Sig Start Date End Date Taking? Authorizing Provider  acetaminophen (TYLENOL) 325 MG tablet Take  2 tablets (650 mg total) by mouth every 4 (four) hours as needed (for pain scale < 4). 06/18/20   Myriam Jacobson, Arlana Pouch, MD  docusate sodium (COLACE) 100 MG capsule Take 1 capsule (100 mg total) by mouth 2 (two) times daily. Patient not taking: No sig reported 06/18/20   Gita Kudo, MD  ibuprofen (ADVIL) 600 MG tablet Take 1 tablet (600 mg total) by mouth every 6 (six) hours. Patient not taking: No sig reported 06/18/20   Gita Kudo, MD    Allergies    Risperidone and related and Shellfish allergy  Review of Systems   Review of Systems  Constitutional:  Negative for fever.  HENT:  Negative for facial swelling, sore throat and trouble swallowing.   Respiratory:  Positive for shortness of breath. Negative for cough.   Cardiovascular:  Negative for chest pain.  Gastrointestinal:  Negative for diarrhea and vomiting.  Skin:   Negative for rash.  All other systems reviewed and are negative.  Physical Exam Updated Vital Signs BP 115/87   Pulse 76   Temp 98.2 F (36.8 C) (Oral)   Resp 20   Ht 1.575 m (5\' 2" )   Wt 66.2 kg   SpO2 100%   BMI 26.70 kg/m   Physical Exam CONSTITUTIONAL: Well developed/well nourished HEAD: Normocephalic/atraumatic EYES: EOMI/PERRL ENMT: Mucous membranes moist, no angioedema, no stridor NECK: supple no meningeal signs SPINE/BACK:entire spine nontender CV: S1/S2 noted, no murmurs/rubs/gallops noted LUNGS: Lungs are clear to auscultation bilaterally, no apparent distress ABDOMEN: soft, nontender, no rebound or guarding, bowel sounds noted throughout abdomen GU:no cva tenderness NEURO: Pt is awake/alert/appropriate, moves all extremitiesx4.  No facial droop.   EXTREMITIES: pulses normal/equal, full ROM, no calf tenderness or edema SKIN: warm, color normal, no rash PSYCH: no abnormalities of mood noted, alert and oriented to situation  ED Results / Procedures / Treatments   Labs (all labs ordered are listed, but only abnormal results are displayed) Labs Reviewed - No data to display  EKG EKG Interpretation  Date/Time:  Wednesday November 16 2020 04:48:41 EDT Ventricular Rate:  70 PR Interval:  149 QRS Duration: 78 QT Interval:  383 QTC Calculation: 414 R Axis:   59 Text Interpretation: Sinus rhythm No significant change since last tracing Confirmed by [birth year] (Zadie Rhine) on 11/16/2020 5:40:26 AM  Radiology No results found.  Procedures Procedures   Medications Ordered in ED Medications - No data to display  ED Course  I have reviewed the triage vital signs and the nursing notes.     MDM Rules/Calculators/A&P                           Patient reports mild chest tightness and shortness of breath.  She is in no acute distress, smiling and appears well.  She is afebrile and there is no added lung sounds. EKG is essentially unremarkable.  She thought  she likely have allergic reaction but there is no evidence of this Low suspicion for ACS/PE/CHF/pneumonia at this time. Final Clinical Impression(s) / ED Diagnoses Final diagnoses:  Shortness of breath    Rx / DC Orders ED Discharge Orders     None        11/18/2020, MD 11/16/20 [phone]

## 2020-11-22 ENCOUNTER — Other Ambulatory Visit: Payer: Self-pay | Admitting: Family Medicine

## 2020-11-22 ENCOUNTER — Other Ambulatory Visit (HOSPITAL_COMMUNITY): Payer: Self-pay | Admitting: Family Medicine

## 2020-11-22 DIAGNOSIS — R945 Abnormal results of liver function studies: Secondary | ICD-10-CM

## 2020-11-22 DIAGNOSIS — R7989 Other specified abnormal findings of blood chemistry: Secondary | ICD-10-CM

## 2020-12-01 ENCOUNTER — Other Ambulatory Visit: Payer: Self-pay

## 2020-12-01 ENCOUNTER — Ambulatory Visit (HOSPITAL_COMMUNITY)
Admission: RE | Admit: 2020-12-01 | Discharge: 2020-12-01 | Disposition: A | Discharge status: Home / Self Care | Payer: 59 | Source: Ambulatory Visit | Attending: Family Medicine | Admitting: Family Medicine

## 2020-12-01 DIAGNOSIS — R7989 Other specified abnormal findings of blood chemistry: Secondary | ICD-10-CM

## 2020-12-01 DIAGNOSIS — R945 Abnormal results of liver function studies: Secondary | ICD-10-CM | POA: Insufficient documentation

## 2020-12-19 ENCOUNTER — Emergency Department (HOSPITAL_COMMUNITY)
Admission: EM | Admit: 2020-12-19 | Discharge: 2020-12-20 | Disposition: A | Discharge status: Home / Self Care | Payer: 59 | Attending: Emergency Medicine | Admitting: Emergency Medicine

## 2020-12-19 ENCOUNTER — Other Ambulatory Visit: Payer: Self-pay

## 2020-12-19 ENCOUNTER — Emergency Department (HOSPITAL_COMMUNITY): Payer: 59

## 2020-12-19 DIAGNOSIS — K8020 Calculus of gallbladder without cholecystitis without obstruction: Secondary | ICD-10-CM | POA: Diagnosis not present

## 2020-12-19 DIAGNOSIS — Z5321 Procedure and treatment not carried out due to patient leaving prior to being seen by health care provider: Secondary | ICD-10-CM | POA: Insufficient documentation

## 2020-12-19 DIAGNOSIS — R1011 Right upper quadrant pain: Secondary | ICD-10-CM | POA: Diagnosis present

## 2020-12-19 DIAGNOSIS — Z7722 Contact with and (suspected) exposure to environmental tobacco smoke (acute) (chronic): Secondary | ICD-10-CM | POA: Insufficient documentation

## 2020-12-19 LAB — CBC WITH DIFFERENTIAL/PLATELET
Abs Immature Granulocytes: 0.03 10*3/uL (ref 0.00–0.07)
Basophils Absolute: 0.1 10*3/uL (ref 0.0–0.1)
Basophils Relative: 1 %
Eosinophils Absolute: 0.5 10*3/uL (ref 0.0–0.5)
Eosinophils Relative: 6 %
HCT: 42.1 % (ref 36.0–46.0)
Hemoglobin: 13.7 g/dL (ref 12.0–15.0)
Immature Granulocytes: 0 %
Lymphocytes Relative: 30 %
Lymphs Abs: 2.3 10*3/uL (ref 0.7–4.0)
MCH: 26.7 pg (ref 26.0–34.0)
MCHC: 32.5 g/dL (ref 30.0–36.0)
MCV: 81.9 fL (ref 80.0–100.0)
Monocytes Absolute: 0.5 10*3/uL (ref 0.1–1.0)
Monocytes Relative: 6 %
Neutro Abs: 4.3 10*3/uL (ref 1.7–7.7)
Neutrophils Relative %: 57 %
Platelets: 303 10*3/uL (ref 150–400)
RBC: 5.14 MIL/uL — ABNORMAL HIGH (ref 3.87–5.11)
RDW: 14.5 % (ref 11.5–15.5)
WBC: 7.7 10*3/uL (ref 4.0–10.5)
nRBC: 0 % (ref 0.0–0.2)

## 2020-12-19 LAB — URINALYSIS, ROUTINE W REFLEX MICROSCOPIC
Bacteria, UA: NONE SEEN
Bilirubin Urine: NEGATIVE
Glucose, UA: NEGATIVE mg/dL
Hgb urine dipstick: NEGATIVE
Ketones, ur: NEGATIVE mg/dL
Nitrite: NEGATIVE
Protein, ur: NEGATIVE mg/dL
Specific Gravity, Urine: 1.01 (ref 1.005–1.030)
pH: 6 (ref 5.0–8.0)

## 2020-12-19 LAB — I-STAT BETA HCG BLOOD, ED (MC, WL, AP ONLY): I-stat hCG, quantitative: <5 m[IU]/mL (ref <5)

## 2020-12-19 LAB — COMPREHENSIVE METABOLIC PANEL
ALT: 121 U/L — ABNORMAL HIGH (ref 0–44)
AST: 47 U/L — ABNORMAL HIGH (ref 15–41)
Albumin: 4.2 g/dL (ref 3.5–5.0)
Alkaline Phosphatase: 102 U/L (ref 38–126)
Anion gap: 9 (ref 5–15)
BUN: 10 mg/dL (ref 6–20)
CO2: 22 mmol/L (ref 22–32)
Calcium: 9.2 mg/dL (ref 8.9–10.3)
Chloride: 108 mmol/L (ref 98–111)
Creatinine, Ser: 0.63 mg/dL (ref 0.44–1.00)
GFR, Estimated: >60 mL/min (ref >60)
Glucose, Bld: 88 mg/dL (ref 70–99)
Potassium: 3.6 mmol/L (ref 3.5–5.1)
Sodium: 139 mmol/L (ref 135–145)
Total Bilirubin: 0.5 mg/dL (ref 0.3–1.2)
Total Protein: 7.3 g/dL (ref 6.5–8.1)

## 2020-12-19 LAB — LIPASE, BLOOD: Lipase: 30 U/L (ref 11–51)

## 2020-12-19 NOTE — ED Triage Notes (Signed)
Pt in for  right sided abdominal pain. Pt endorses that she has gallstones. Pt states that she had an [place] that determined she had gallstones. Pt has complaints of ongoing nausea. Pt states ab pain is a stabbing pain that radiates on right side. Pt has no c/o black, tarry or loose stools at this time.

## 2020-12-19 NOTE — ED Provider Notes (Signed)
Emergency Medicine Provider Triage Evaluation Note  Frances [name] , a 20 y.o. female  was evaluated in triage.  Pt complains of ruq pain ongoing for several weeks but worsening. Known gallstones, no nv. No fevers.  Review of Systems  Positive: Ruq abd pain Negative: Fever, nv  Physical Exam  BP 124/85 (BP Location: Right Arm)   Pulse 80   Temp 98.8 F (37.1 C) (Oral)   Resp 16   Ht 5\' 3"  (1.6 m)   Wt 67.1 kg   SpO2 98%   BMI 26.22 kg/m  Gen:   Awake, no distress   Resp:  Normal effort  MSK:   Moves extremities without difficulty Other:  Ruq ttp  Medical Decision Making  Medically screening exam initiated at 3:37 PM.  Appropriate orders placed.  Lakie Mclouth was informed that the remainder of the evaluation will be completed by another provider, this initial triage assessment does not replace that evaluation, and the importance of remaining in the ED until their evaluation is complete.     Elmer Picker, PA-C 12/19/20 1538    12/21/20, MD 12/19/20 2040

## 2020-12-20 ENCOUNTER — Other Ambulatory Visit: Payer: Self-pay

## 2020-12-20 ENCOUNTER — Emergency Department (HOSPITAL_COMMUNITY)
Admission: EM | Admit: 2020-12-20 | Discharge: 2020-12-20 | Disposition: A | Discharge status: Home / Self Care | Payer: 59 | Source: Home / Self Care | Attending: Emergency Medicine | Admitting: Emergency Medicine

## 2020-12-20 ENCOUNTER — Encounter (HOSPITAL_COMMUNITY): Payer: Self-pay | Admitting: *Deleted

## 2020-12-20 DIAGNOSIS — R1011 Right upper quadrant pain: Secondary | ICD-10-CM

## 2020-12-20 DIAGNOSIS — Z7722 Contact with and (suspected) exposure to environmental tobacco smoke (acute) (chronic): Secondary | ICD-10-CM | POA: Insufficient documentation

## 2020-12-20 DIAGNOSIS — K8020 Calculus of gallbladder without cholecystitis without obstruction: Secondary | ICD-10-CM

## 2020-12-20 HISTORY — DX: Calculus of gallbladder without cholecystitis without obstruction: K80.20

## 2020-12-20 LAB — CBC
HCT: 42 % (ref 36.0–46.0)
Hemoglobin: 13.5 g/dL (ref 12.0–15.0)
MCH: 26.6 pg (ref 26.0–34.0)
MCHC: 32.1 g/dL (ref 30.0–36.0)
MCV: 82.8 fL (ref 80.0–100.0)
Platelets: 287 10*3/uL (ref 150–400)
RBC: 5.07 MIL/uL (ref 3.87–5.11)
RDW: 14.7 % (ref 11.5–15.5)
WBC: 6.7 10*3/uL (ref 4.0–10.5)
nRBC: 0 % (ref 0.0–0.2)

## 2020-12-20 LAB — COMPREHENSIVE METABOLIC PANEL
ALT: 97 U/L — ABNORMAL HIGH (ref 0–44)
AST: 30 U/L (ref 15–41)
Albumin: 4.3 g/dL (ref 3.5–5.0)
Alkaline Phosphatase: 99 U/L (ref 38–126)
Anion gap: 7 (ref 5–15)
BUN: 12 mg/dL (ref 6–20)
CO2: 23 mmol/L (ref 22–32)
Calcium: 8.8 mg/dL — ABNORMAL LOW (ref 8.9–10.3)
Chloride: 107 mmol/L (ref 98–111)
Creatinine, Ser: 0.61 mg/dL (ref 0.44–1.00)
GFR, Estimated: >60 mL/min (ref >60)
Glucose, Bld: 84 mg/dL (ref 70–99)
Potassium: 3.5 mmol/L (ref 3.5–5.1)
Sodium: 137 mmol/L (ref 135–145)
Total Bilirubin: 1 mg/dL (ref 0.3–1.2)
Total Protein: 7.5 g/dL (ref 6.5–8.1)

## 2020-12-20 LAB — LIPASE, BLOOD: Lipase: 27 U/L (ref 11–51)

## 2020-12-20 MED ORDER — PANTOPRAZOLE SODIUM 40 MG PO TBEC: 40.0000 mg | DELAYED_RELEASE_TABLET | Freq: Every day | ORAL | 0 refills | Status: DC

## 2020-12-20 MED ORDER — TRAMADOL HCL 50 MG PO TABS: 50.0000 mg | ORAL_TABLET | Freq: Four times a day (QID) | ORAL | 0 refills | Status: DC | PRN

## 2020-12-20 NOTE — ED Notes (Addendum)
Pt called multiple times no answer 

## 2020-12-20 NOTE — ED Provider Notes (Signed)
[facility] EMERGENCY DEPARTMENT Provider Note   CSN: [id number] Arrival date & time: 12/20/20  1221     History Chief Complaint  Patient presents with   Abdominal Pain    Frances [name] is a 20 y.o. female.   Abdominal Pain Associated symptoms: nausea   Associated symptoms: no chest pain, no cough, no dysuria, no fever, no shortness of breath, no vaginal bleeding, no vaginal discharge and no vomiting       Frances [name] is a 20 y.o. female who presents to the Emergency Department complaining of RUQ abdominal pain.  She describes constant aching pain of her right upper abdomen that worsens with food intake.  Pain is been associated with nausea.  She has been seen by her PCP and diagnosed with gallstones by ultrasound 1 month ago.  She is awaiting referral information for general surgery.  She states the pain has been worse x2 weeks.  She went to [facility] emergency department last evening and had repeat ultrasound and blood work, but did stay for complete work-up due to excessive wait time.  She denies dysuria, fever, vomiting, diarrhea, chest pain or shortness of breath.  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  Leonia Corona, MD;  Location: [facility] OR;  Service: Pediatrics;  Laterality: N/A;   TONSILLECTOMY       OB History     Gravida  2   Para  2   Term  1   Preterm  1   AB      Living  2      SAB      IAB      Ectopic      Multiple  0   Live Births  2           Family History  Problem Relation Age of Onset   Cancer Maternal Grandmother    Heart disease Maternal Grandmother    Cancer Maternal Grandfather    Diabetes Maternal Grandfather    Heart disease Maternal Grandfather    Cancer Paternal Grandmother    Heart disease Paternal Grandmother    Cancer Paternal Grandfather    Heart disease Paternal Grandfather     Social History   Tobacco Use   Smoking status: Never    Passive exposure: Yes   Smokeless tobacco: Never  Vaping Use   Vaping Use: Every day  Substance Use Topics   Alcohol use: Yes    Comment: occ   Drug use: No    Home Medications Prior to Admission medications   Medication Sig Start Date End Date Taking? Authorizing Provider  acetaminophen (TYLENOL) 325 MG tablet Take 2 tablets (650 mg total) by mouth every 4 (four) hours as needed (for pain  scale < 4). 06/18/20   Gita Kudo, MD  docusate sodium (COLACE) 100 MG capsule Take 1 capsule (100 mg total) by mouth 2 (two) times daily. Patient not taking: No sig reported 06/18/20   Gita Kudo, MD  ibuprofen (ADVIL) 600 MG tablet Take 1 tablet (600 mg total) by mouth every 6 (six) hours. Patient not taking: No sig reported 06/18/20   Gita Kudo, MD    Allergies    Risperidone and related and Shellfish allergy  Review of Systems   Review of Systems  Constitutional:  Positive for appetite change. Negative for fever.  Respiratory:  Negative for cough and shortness of breath.   Cardiovascular:  Negative for chest pain.  Gastrointestinal:  Positive for abdominal pain and nausea. Negative for vomiting.  Genitourinary:  Negative for difficulty urinating, dysuria, frequency, vaginal  bleeding and vaginal discharge.  Musculoskeletal:  Negative for back pain.  Skin:  Negative for rash.  Neurological:  Negative for dizziness and weakness.   Physical Exam Updated Vital Signs BP 129/84 (BP Location: Right Arm)   Pulse 75   Resp 16   Ht 5\' 3"  (1.6 m)   Wt 70.8 kg   SpO2 100%   BMI 27.65 kg/m   Physical Exam Vitals and nursing note reviewed.  Constitutional:      General: She is not in acute distress.    Appearance: Normal appearance. She is well-developed.  HENT:     Head: Normocephalic.     Mouth/Throat:     Mouth: Mucous membranes are moist.  Eyes:     Pupils: Pupils are equal, round, and reactive to light.  Neck:     Thyroid: No thyromegaly.     Meningeal: Kernig's sign absent.  Cardiovascular:     Rate and Rhythm: Normal rate and regular rhythm.     Pulses: Normal pulses.  Pulmonary:     Effort: Pulmonary effort is normal.     Breath sounds: Normal breath sounds. No wheezing.  Abdominal:     Palpations: Abdomen is soft.     Tenderness: There is abdominal tenderness. There is no guarding or rebound.     Comments: Tenderness to epigastric area and right upper quadrant.  Abdomen is soft.  No distention.  Mild guarding noted.  Musculoskeletal:        General: Normal range of motion.     Cervical back: Normal range of motion and neck supple.  Skin:    General: Skin is warm.     Capillary Refill: Capillary refill takes less than 2 seconds.     Coloration: Skin is not jaundiced.     Findings: No rash.  Neurological:     Mental Status: She is alert.     Sensory: No sensory deficit.     Motor: No weakness.    ED Results / Procedures / Treatments   Labs (all labs ordered are listed, but only abnormal results are displayed) Labs Reviewed  COMPREHENSIVE METABOLIC PANEL - Abnormal; Notable for the following components:      Result Value   Calcium 8.8 (*)    ALT 97 (*)    All other components within normal limits  LIPASE, BLOOD  CBC  URINALYSIS,  ROUTINE W REFLEX MICROSCOPIC  POC URINE PREG, ED    EKG None  Radiology Abdomen Limited RUQ (LIVER/GB)  Result Date: 12/19/2020 CLINICAL DATA:  Right upper quadrant pain and nausea x2 weeks. EXAM: ULTRASOUND ABDOMEN LIMITED RIGHT UPPER QUADRANT COMPARISON:  None. FINDINGS:  Gallbladder: Numerous layering cholelithiasis measuring up to 1 cm. No pericholecystic fluid or wall thickening visualized. No sonographic Murphy sign noted by sonographer. Common bile duct: Diameter: 4 mm Liver: No focal lesion identified. Within normal limits in parenchymal echogenicity. Portal vein is patent on color Doppler imaging with normal direction of blood flow towards the liver. Other: None. IMPRESSION: Cholelithiasis without sonographic evidence of acute cholecystitis. Electronically Signed   By: Maudry Mayhew M.D.   On: 12/19/2020 18:42    Procedures Procedures   Medications Ordered in ED Medications - No data to display  ED Course  I have reviewed the triage vital signs and the nursing notes.  Pertinent labs & imaging results that were available during my care of the patient were reviewed by me and considered in my medical decision making (see chart for details).    MDM Rules/Calculators/A&P                           Patient he is here for right upper quadrant pain with known history of gallstones.  Awaiting referral information for general surgery.  She had ultrasound of her abdomen in September that showed multiple gallstones without evidence of acute cholecystitis.  Common bile duct 4 mm.  Went to [facility] ER yesterday and had repeat ultrasound again showing cholelithiasis without evidence of acute cholecystitis.  Labs from yesterday reviewed by me without evidence of leukocytosis and total bilirubin unremarkable.  AST and ALT mildly elevated.  Lipase reassuring.  On exam, patient has some tenderness of the epigastric and right upper quadrant.  Abdomen is soft, mild guarding without distention or  peritoneal signs.  Vital signs reassuring.  She is nontoxic-appearing.  No vomiting or fever.  Findings discussed with patient.  She appears stable at this time.  Doubt acute findings.  We will contact general surgery office to arrange for outpatient follow-up appointment.    appointment scheduled for November 8 at 9 AM with Dr. Henreitta Leber.  Patient agreeable to this plan.  I feel that she is appropriate for discharge home at this time.  We will start her on PPI and short course of pain medication.  Strict return precautions were discussed and all questions were answered.   Final Clinical Impression(s) / ED Diagnoses Final diagnoses:  Right upper quadrant abdominal pain  Calculus of gallbladder without cholecystitis without obstruction    Rx / DC Orders ED Discharge Orders     None        Pauline Aus, PA-C 12/20/20 2312    Bethann Berkshire, MD 12/21/20 [phone]

## 2020-12-20 NOTE — ED Triage Notes (Signed)
Right abdominal pain x 2 weeks, history of gallstones

## 2020-12-20 NOTE — Discharge Instructions (Addendum)
Again your ultrasound from last evening shows that you have multiple gallstones.  You have an appointment to see the general surgeon, Dr. Henreitta Leber on November 8 at 9 AM.  Please keep this appointment.  Bland diet as tolerated, please avoid spicy greasy foods and alcohol.  Return to the emergency department for any new or worsening symptoms such as increasing pain, persistent vomiting and/or fever

## 2020-12-21 ENCOUNTER — Telehealth: Payer: Self-pay

## 2020-12-21 NOTE — Telephone Encounter (Signed)
Transition Care Management Follow-up Telephone Call Date of discharge and from where: 12/20/2020-[place]  How have you been since you were released from the hospital? Patient stated she is doing fine and has started medications.  Any questions or concerns? No  Items Reviewed: Did the pt receive and understand the discharge instructions provided? Yes  Medications obtained and verified? Yes  Other? No  Any new allergies since your discharge? No  Dietary orders reviewed? No Do you have support at home? Yes   Home Care and Equipment/Supplies: Were home health services ordered? not applicable If so, what is the name of the agency? N/A  Has the agency set up a time to come to the patient's home? not applicable Were any new equipment or medical supplies ordered?  No What is the name of the medical supply agency? N/A Were you able to get the supplies/equipment? not applicable Do you have any questions related to the use of the equipment or supplies? No  Functional Questionnaire: (I = Independent and D = Dependent) ADLs: I  Bathing/Dressing- I  Meal Prep- I  Eating- I  Maintaining continence- I  Transferring/Ambulation- I  Managing Meds- I  Follow up appointments reviewed:  PCP Hospital f/u appt confirmed? No   Specialist Hospital f/u appt confirmed? No   Are transportation arrangements needed? No  If their condition worsens, is the pt aware to call PCP or go to the Emergency Dept.? Yes Was the patient provided with contact information for the PCP's office or ED? Yes Was to pt encouraged to call back with questions or concerns? Yes

## 2021-01-10 ENCOUNTER — Encounter: Payer: Self-pay | Admitting: General Surgery

## 2021-01-10 ENCOUNTER — Ambulatory Visit (INDEPENDENT_AMBULATORY_CARE_PROVIDER_SITE_OTHER): Payer: 59 | Admitting: General Surgery

## 2021-01-10 ENCOUNTER — Other Ambulatory Visit: Payer: Self-pay

## 2021-01-10 VITALS — BP 113/79 | HR 86 | Temp 98.6°F | Resp 14 | Ht 63.0 in | Wt 158.0 lb

## 2021-01-10 DIAGNOSIS — K8020 Calculus of gallbladder without cholecystitis without obstruction: Secondary | ICD-10-CM | POA: Diagnosis not present

## 2021-01-10 NOTE — Progress Notes (Signed)
[facility] History and Physical  Reason for Referral: Gallstones  Referring Physician: Benita Stabile, MD   Chief Complaint   New Patient (Initial Visit)     Frances [name] is a 20 y.o. female.  HPI: Frances [name] is a 20 yo who has been having RUQ pain that is sharp in nature and some nausea that has been getting worse over the past month. The episodes are getting more intense and are not really associated with food at this point. She says it is keeping her from helping with her kids at times. The episodes have been going on for at least 3 months, and she has pretty much decreased her diet and does not eat because of the nausea. She says now the episodes are random in nature and she even had to go to the ED for an episode.  She has a 78 month old and a 58.21 year old.    Past Medical History:  Diagnosis Date   ADHD (attention deficit hyperactivity disorder)    Allergy    Depressed    Gallstones    ODD (oppositional defiant disorder)    Post traumatic stress disorder    PTSD (post-traumatic stress disorder)     Past Surgical History:  Procedure Laterality Date   APPENDECTOMY     LAPAROSCOPIC APPENDECTOMY N/A 01/18/2013   Procedure: APPENDECTOMY LAPAROSCOPIC;  Surgeon: Judie Petit. Leonia Corona, MD;  Location: [facility] OR;  Service: Pediatrics;  Laterality: N/A;   TONSILLECTOMY      Family History  Problem Relation Age of Onset   Cancer Maternal Grandmother    Heart disease Maternal Grandmother    Cancer Maternal Grandfather    Diabetes Maternal Grandfather    Heart disease Maternal Grandfather    Cancer Paternal Grandmother    Heart disease Paternal Grandmother    Cancer Paternal Grandfather    Heart disease Paternal Grandfather     Social History   Tobacco Use   Smoking status: Never    Passive exposure: Yes   Smokeless tobacco: Never  Vaping Use   Vaping Use: Every day  Substance Use Topics   Alcohol use: Yes    Comment: occ   Drug use: No     Medications: I have reviewed the patient's current medications. Allergies as of 01/10/2021       Reactions   Risperidone And Related Hives   Shellfish Allergy Hives, Nausea And Vomiting, Swelling        Medication List        Accurate as of January 10, 2021  9:13 AM. If you have any questions, ask your nurse or doctor.          STOP taking these medications    acetaminophen 325 MG tablet Commonly known as: Tylenol Stopped by: Lucretia Roers, MD   docusate sodium 100 MG capsule Commonly known as: COLACE Stopped by: Lucretia Roers, MD   ibuprofen 600 MG tablet Commonly known as: ADVIL Stopped by: Lucretia Roers, MD       TAKE these medications    pantoprazole 40 MG tablet Commonly known as: PROTONIX Take 1 tablet (40 mg total) by mouth daily.   traMADol 50 MG tablet Commonly known as: ULTRAM Take 1 tablet (50 mg total) by mouth every 6 (six) hours as needed.         ROS:  A comprehensive review of systems was negative except for: Gastrointestinal: positive for abdominal pain and nausea Reflux symptoms in chest   Blood  pressure 113/79, pulse 86, temperature 98.6 F (37 C), temperature source Other (Comment), resp. rate 14, height 5\' 3"  (1.6 m), weight 158 lb (71.7 kg), SpO2 97 %, not currently breastfeeding. Physical Exam Vitals reviewed.  Constitutional:      Appearance: Normal appearance.  HENT:     Head: Normocephalic.     Mouth/Throat:     Mouth: Mucous membranes are moist.  Eyes:     Extraocular Movements: Extraocular movements intact.  Cardiovascular:     Rate and Rhythm: Normal rate and regular rhythm.  Pulmonary:     Effort: Pulmonary effort is normal.     Breath sounds: Normal breath sounds.  Abdominal:     General: There is no distension.     Palpations: Abdomen is soft.     Tenderness: There is abdominal tenderness in the right upper quadrant.  Musculoskeletal:        General: Normal range of motion.     Cervical  back: Normal range of motion.  Skin:    General: Skin is warm.  Neurological:     General: No focal deficit present.     Mental Status: She is alert and oriented to person, place, and time.  Psychiatric:        Mood and Affect: Mood normal.    Results: CLINICAL DATA:  Right upper quadrant pain and nausea x2 weeks.   EXAM: ULTRASOUND ABDOMEN LIMITED RIGHT UPPER QUADRANT   COMPARISON:  None.   FINDINGS: Gallbladder:   Numerous layering cholelithiasis measuring up to 1 cm. No pericholecystic fluid or wall thickening visualized. No sonographic Murphy sign noted by sonographer.   Common bile duct:   Diameter: 4 mm   Liver:   No focal lesion identified. Within normal limits in parenchymal echogenicity. Portal vein is patent on color Doppler imaging with normal direction of blood flow towards the liver.   Other: None.   IMPRESSION: Cholelithiasis without sonographic evidence of acute cholecystitis.     Electronically Signed   By: Dahlia Bailiff M.D.   On: 12/19/2020 18:42   Assessment & Plan:  Frances [name] is a 20 y.o. female with gallstone that are symptomatic.   PLAN: I counseled the patient about the indication, risks and benefits of laparoscopic cholecystectomy.  She understands there is a very small chance for bleeding, infection, injury to normal structures (including common bile duct), conversion to open surgery, persistent symptoms, evolution of postcholecystectomy diarrhea, need for secondary interventions, anesthesia reaction, cardiopulmonary issues and other risks not specifically detailed here. I described the expected recovery, the plan for follow-up and the restrictions during the recovery phase.  All questions were answered.   All questions were answered to the satisfaction of the patient.    Virl Cagey 01/10/2021, 9:13 AM

## 2021-01-10 NOTE — Patient Instructions (Signed)
Minimally Invasive Cholecystectomy °Minimally invasive cholecystectomy is surgery to remove the gallbladder. The gallbladder is a pear-shaped organ that lies beneath the liver on the right side of the body. The gallbladder stores bile, which is a fluid that helps the body digest fats. Cholecystectomy is often done to treat inflammation (irritation and swelling) of the gallbladder (cholecystitis). This condition is usually caused by a buildup of gallstones (cholelithiasis) in the gallbladder or when the fluid in the gall bladder becomes stagnant because gallstones get stuck in the ducts (tubes) and block the flow of bile. This can result in inflammation and pain. In severe cases, emergency surgery may be required. °This procedure is done through small incisions in the abdomen, instead of one large incision. It is also called laparoscopic surgery. A thin scope with a camera (laparoscope) is inserted through one incision. Then surgical instruments are inserted through the other incisions. In some cases, a minimally invasive surgery may need to be changed to a surgery that is done through a larger incision. This is called open surgery. °Tell a health care provider about: °Any allergies you have. °All medicines you are taking, including vitamins, herbs, eye drops, creams, and over-the-counter medicines. °Any problems you or family members have had with anesthetic medicines. °Any bleeding problems you have. °Any surgeries you have had. °Any medical conditions you have. °Whether you are pregnant or may be pregnant. °What are the risks? °Generally, this is a safe procedure. However, problems may occur, including: °Infection. °Bleeding. °Allergic reactions to medicines. °Damage to nearby structures or organs. °A gallstone remaining in the common bile duct. The common bile duct carries bile from the gallbladder to the small intestine. °A bile leak from the liver or cystic duct after your gallbladder is removed. °What happens  before the procedure? °Medicines °Ask your health care provider about: °Changing or stopping your regular medicines. This is especially important if you are taking diabetes medicines or blood thinners. °Taking medicines such as aspirin and ibuprofen. These medicines can thin your blood. Do not take these medicines unless your health care provider tells you to take them. °Taking over-the-counter medicines, vitamins, herbs, and supplements. °General instructions °If you will be going home right after the procedure, plan to have a responsible adult: °Take you home from the hospital or clinic. You will not be allowed to drive. °Care for you for the time you are told. °Do not use any products that contain nicotine or tobacco for at least 4 weeks before the procedure. These products include cigarettes, chewing tobacco, and vaping devices, such as e-cigarettes. If you need help quitting, ask your health care provider. °Ask your health care provider: °How your surgery site will be marked. °What steps will be taken to help prevent infection. These may include: °Removing hair at the surgery site. °Washing skin with a germ-killing soap. °Taking antibiotic medicine. °What happens during the procedure? ° °An IV will be inserted into one of your veins. °You will be given one or both of the following: °A medicine to help you relax (sedative). °A medicine to make you fall asleep (general anesthetic). °Your surgeon will make several small incisions in your abdomen. °The laparoscope will be inserted through one of the small incisions. The camera on the laparoscope will send images to a monitor in the operating room. This lets your surgeon see inside your abdomen. °A gas will be pumped into your abdomen. This will expand your abdomen to give the surgeon more room to perform the surgery. °Other tools that   are needed for the procedure will be inserted through the other incisions. The gallbladder will be removed through one of the  incisions. °Your common bile duct may be examined. If stones are found in the common bile duct, they may be removed. °After your gallbladder has been removed, the incisions will be closed with stitches (sutures), staples, or skin glue. °Your incisions will be covered with a bandage (dressing). °The procedure may vary among health care providers and hospitals. °What happens after the procedure? °Your blood pressure, heart rate, breathing rate, and blood oxygen level will be monitored until you leave the hospital or clinic. °You will be given medicines as needed to control your pain. °You may have a drain placed in the incision. The drain will be removed a day or two after the procedure. °Summary °Minimally invasive cholecystectomy, also called laparoscopic cholecystectomy, is surgery to remove the gallbladder using small incisions. °Tell your health care provider about all the medical conditions you have and all the medicines you are taking for those conditions. °Before the procedure, follow instructions about when to stop eating and drinking and changing or stopping medicines. °Plan to have a responsible adult care for you for the time you are told after you leave the hospital or clinic. °This information is not intended to replace advice given to you by your health care provider. Make sure you discuss any questions you have with your health care provider. °Document Revised: 08/23/2020 Document Reviewed: 08/23/2020 °[employer] Patient Education © 2022 [employer] °Cholelithiasis °Cholelithiasis is a disease in which gallstones form in the gallbladder. The gallbladder is an organ that stores bile. Bile is a fluid that helps to digest fats. Gallstones begin as small crystals and can slowly grow into stones. They may cause no symptoms until they block the gallbladder duct, or cystic duct, when the gallbladder tightens (contracts) after food is eaten. This can cause pain and is known as a gallbladder attack, or biliary  colic. °There are two main types of gallstones: °Cholesterol stones. These are the most common type of gallstone. These stones are made of hardened cholesterol and are usually yellow-green in color. Cholesterol is a fat-like substance that is made in the liver. °Pigment stones. These are dark in color and are made of a red-yellow substance, called bilirubin,that forms when hemoglobin from red blood cells breaks down. °What are the causes? °This condition may be caused by an imbalance in the different parts that make bile. This can happen if the bile: °Has too much bilirubin. This can happen in certain blood diseases, such as sickle cell anemia. °Has too much cholesterol. °Does not have enough bile salts. These salts help the body absorb and digest fats. °In some cases, this condition can also be caused by the gallbladder not emptying completely or often enough. This is common during pregnancy. °What increases the risk? °The following factors may make you more likely to develop this condition: °Being female. °Having multiple pregnancies. Health care providers sometimes advise removing diseased gallbladders before future pregnancies. °Eating a diet that is heavy in fried foods, fat, and refined carbohydrates, such as white bread and white rice. °Being obese. °Being older than age 40. °Using medicines that contain female hormones (estrogen) for a long time. °Losing weight quickly. °Having a family history of gallstones. °Having certain medical problems, such as: °Diabetes mellitus. °Cystic fibrosis. °Crohn's disease. °Cirrhosis or other long-term (chronic) liver disease. °Certain blood diseases, such as sickle cell anemia or leukemia. °What are the signs or symptoms? °In   many cases, having gallstones causes no symptoms. When you have gallstones but do not have symptoms, you have silent gallstones. If a gallstone blocks your bile duct, it can cause a gallbladder attack. The main symptom of a gallbladder attack is sudden  pain in the upper right part of the abdomen. The pain: °Usually comes at night or after eating. °Can last for one hour or more. °Can spread to your right shoulder, back, or chest. °Can feel like indigestion. This is discomfort, burning, or fullness in your upper abdomen. °If the bile duct is blocked for more than a few hours, it can cause an infection or inflammation of your gallbladder (cholecystitis), liver, or pancreas. This can cause: °Nausea or vomiting. °Bloating. °Pain in your abdomen that lasts for 5 hours or longer. °Tenderness in your upper abdomen, often in the upper right section and under your rib cage. °Fever or chills. °Skin or the white parts of your eyes turning yellow (jaundice). This usually happens when a stone has blocked bile from passing through the common bile duct. °Dark urine or light-colored stools. °How is this diagnosed? °This condition may be diagnosed based on: °A physical exam. °Your medical history. °Ultrasound. °CT scan. °MRI. °You may also have other tests, including: °Blood tests to check for signs of an infection or inflammation. °Cholescintigraphy, or HIDA scan. This is a scan of your gallbladder and bile ducts (biliary system) using non-harmful radioactive material and special cameras that can see the radioactive material. °Endoscopic retrograde cholangiopancreatogram. This involves inserting a small tube with a camera on the end (endoscope) through your mouth to look at bile ducts and check for blockages. °How is this treated? °Treatment for this condition depends on the severity of the condition. Silent gallstones do not need treatment. Treatment may be needed if a blockage causes a gallbladder attack or other symptoms. Treatment may include: °Home care, if symptoms are not severe. °During a simple gallbladder attack, stop eating and drinking for 12-24 hours (except for water and clear liquids). This helps to "cool down" your gallbladder. After 1 or 2 days, you can start to  eat a diet of simple or clear foods, such as broths and crackers. °You may also need medicines for pain or nausea or both. °If you have cholecystitis and an infection, you will need antibiotics. °A hospital stay, if needed for pain control or for cholecystitis with severe infection. °Cholecystectomy, or surgery to remove your gallbladder. This is the most common treatment if all other treatments have not worked. °Medicines to break up gallstones. These are most effective at treating small gallstones. Medicines may be used for up to 6-12 months. °Endoscopic retrograde cholangiopancreatogram. A small basket can be attached to the endoscope and used to capture and remove gallstones, mainly those that are in the common bile duct. °Follow these instructions at home: °Medicines °Take over-the-counter and prescription medicines only as told by your health care provider. °If you were prescribed an antibiotic medicine, take it as told by your health care provider. Do not stop taking the antibiotic even if you start to feel better. °Ask your health care provider if the medicine prescribed to you requires you to avoid driving or using machinery. °Eating and drinking °Drink enough fluid to keep your urine pale yellow. This is important during a gallbladder attack. Water and clear liquids are preferred. °Follow a healthy diet. This includes: °Reducing fatty foods, such as fried food and foods high in cholesterol. °Reducing refined carbohydrates, such as white bread and white   rice. °Eating more fiber. Aim for foods such as almonds, fruit, and beans. °Alcohol use °If you drink alcohol: °Limit how much you use to: °0-1 drink a day for nonpregnant women. °0-2 drinks a day for men. °Be aware of how much alcohol is in your drink. In the [place] one drink equals one 12 oz bottle of beer (355 mL), one 5 oz glass of wine (148 mL), or one 1½ oz glass of hard liquor (44 mL). °General instructions °Do not use any products that contain  nicotine or tobacco, such as cigarettes, e-cigarettes, and chewing tobacco. If you need help quitting, ask your health care provider. °Maintain a healthy weight. °Keep all follow-up visits as told by your health care provider. These may include consultations with a surgeon or specialist. This is important. °Where to find more information °[employer] and [employer]: www.niddk.nih.gov °Contact a health care provider if: °You think you have had a gallbladder attack. °You have been diagnosed with silent gallstones and you develop pain in your abdomen or indigestion. °You begin to have attacks more often. °You have dark urine or light-colored stools. °Get help right away if: °You have pain from a gallbladder attack that lasts for more than 2 hours. °You have pain in your abdomen that lasts for more than 5 hours or is getting worse. °You have a fever or chills. °You have nausea and vomiting that do not go away. °You develop jaundice. °Summary °Cholelithiasis is a disease in which gallstones form in the gallbladder. °This condition may be caused by an imbalance in the different parts that make bile. This can happen if your bile has too much bilirubin or cholesterol, or does not have enough bile salts. °Treatment for gallstones depends on the severity of the condition. Silent gallstones do not need treatment. °If gallstones cause a gallbladder attack or other symptoms, treatment usually involves not eating or drinking anything. Treatment may also include pain medicines and antibiotics, and it sometimes includes a hospital stay. °Surgery to remove the gallbladder is common if all other treatments have not worked. °This information is not intended to replace advice given to you by your health care provider. Make sure you discuss any questions you have with your health care provider. °Document Revised: 01/12/2019 Document Reviewed: 01/12/2019 °[employer]vier Patient Education © 2022 [employer] ° °

## 2021-01-10 NOTE — Patient Instructions (Signed)
Frances [name]  01/10/2021     @PREFPERIOPPHARMACY @   Your procedure is scheduled on  01/13/2021.   Report to [facility] at  1100  A.M.   Call this number if you have problems the morning of surgery:  [phone]   Remember:  Do not eat or drink after midnight.      Take these medicines the morning of surgery with A SIP OF WATER                                      protonix, tramadol.     Do not wear jewelry, make-up or nail polish.  Do not wear lotions, powders, or perfumes, or deodorant.  Do not shave 48 hours prior to surgery.  Men may shave face and neck.  Do not bring valuables to the hospital.  [facility] is not responsible for any belongings or valuables.  Contacts, dentures or bridgework may not be worn into surgery.  Leave your suitcase in the car.  After surgery it may be brought to your room.  For patients admitted to the hospital, discharge time will be determined by your treatment team.  Patients discharged the day of surgery will not be allowed to drive home and must have someone with them for 24 hours.     Special instructions:   DO NOT smoke tobacco or vape for 24 hours before your procedure.  Please read over the following fact sheets that you were given. Coughing and Deep Breathing, Surgical Site Infection Prevention, Anesthesia Post-op Instructions, and Care and Recovery After Surgery      Minimally Invasive Cholecystectomy, Care After The following information offers guidance on how to care for yourself after your procedure. Your health care provider may also give you more specific instructions. If you have problems or questions, contact your health care provider. What can I expect after the procedure? After the procedure, it is common to have: Pain at your incision sites. You will be given medicines to control this pain. Mild nausea or vomiting. Bloating and possible shoulder pain from the gas that was used during the  procedure. Follow these instructions at home: Medicines Take over-the-counter and prescription medicines only as told by your health care provider. If you were prescribed an antibiotic medicine, take it as told by your health care provider. Do not stop using the antibiotic even if you start to feel better. Ask your health care provider if the medicine prescribed to you: Requires you to avoid driving or using machinery. Can cause constipation. You may need to take these actions to prevent or treat constipation: Drink enough fluid to keep your urine pale yellow. Take over-the-counter or prescription medicines. Eat foods that are high in fiber, such as beans, whole grains, and fresh fruits and vegetables. Limit foods that are high in fat and processed sugars, such as fried or sweet foods. Incision care  Follow instructions from your health care provider about how to take care of your incisions. Make sure you: Wash your hands with soap and water for at least 20 seconds before and after you change your bandage (dressing). If soap and water are not available, use hand sanitizer. Change your dressing as told by your health care provider. Leave stitches (sutures), skin glue, or adhesive strips in place. These skin closures may need to be in place for 2 weeks or longer.  If adhesive strip edges start to loosen and curl up, you may trim the loose edges. Do not remove adhesive strips completely unless your health care provider tells you to do that. Do not take baths, swim, or use a hot tub until your health care provider approves. Ask your health care provider if you may take showers. You may only be allowed to take sponge baths. Check your incision area every day for signs of infection. Check for: More redness, swelling, or pain. Fluid or blood. Warmth. Pus or a bad smell. Activity Rest as told by your health care provider. Do not do activities that require a lot of effort. Avoid sitting for a long  time without moving. Get up to take short walks every 1-2 hours. This is important to improve blood flow and breathing. Ask for help if you feel weak or unsteady. Do not lift anything that is heavier than 10 lb (4.5 kg), or the limit that you are told, until your health care provider says that it is safe. Do not play contact sports until your health care provider approves. Do not return to work or school until your health care provider approves. Return to your normal activities as told by your health care provider. Ask your health care provider what activities are safe for you. General instructions If you were given a sedative during the procedure, it can affect you for several hours. Do not drive or operate machinery until your health care provider says that it is safe. Keep all follow-up visits. This is important. Contact a health care provider if: You develop a rash. You have more redness, swelling, or pain around your incisions. You have fluid or blood coming from your incisions. Your incisions feel warm to the touch. You have pus or a bad smell coming from your incisions. You have a fever. One or more of your incisions breaks open. Get help right away if: You have trouble breathing. You have chest pain. You have more pain in your shoulders. You faint or feel dizzy when you stand. You have severe pain in your abdomen. You have nausea or vomiting that lasts for more than one day. You have leg pain that is new or unusual, or if it is localized to one specific spot. These symptoms may represent a serious problem that is an emergency. Do not wait to see if the symptoms will go away. Get medical help right away. Call your local emergency services (911 in the [place] Do not drive yourself to the hospital. Summary After your procedure, it is common to have pain at the incision sites. You may also have nausea or bloating. Follow your health care provider's instructions about medicine, activity  restrictions, and caring for your incision areas. Do not do activities that require a lot of effort. Contact a health care provider if you have a fever or other signs of infection, such as more redness, swelling, or pain around the incisions. Get help right away if you have chest pain, increasing pain in the shoulders, or trouble breathing. This information is not intended to replace advice given to you by your health care provider. Make sure you discuss any questions you have with your health care provider. Document Revised: 08/23/2020 Document Reviewed: 08/23/2020 Elsevier Patient Education  [place] Anesthesia, Adult, Care After This sheet gives you information about how to care for yourself after your procedure. Your health care provider may also give you more specific instructions. If you have problems or questions,  contact your health care provider. What can I expect after the procedure? After the procedure, the following side effects are common: Pain or discomfort at the IV site. Nausea. Vomiting. Sore throat. Trouble concentrating. Feeling cold or chills. Feeling weak or tired. Sleepiness and fatigue. Soreness and body aches. These side effects can affect parts of the body that were not involved in surgery. Follow these instructions at home: For the time period you were told by your health care provider:  Rest. Do not participate in activities where you could fall or become injured. Do not drive or use machinery. Do not drink alcohol. Do not take sleeping pills or medicines that cause drowsiness. Do not make important decisions or sign legal documents. Do not take care of children on your own. Eating and drinking Follow any instructions from your health care provider about eating or drinking restrictions. When you feel hungry, start by eating small amounts of foods that are soft and easy to digest (bland), such as toast. Gradually return to your regular  diet. Drink enough fluid to keep your urine pale yellow. If you vomit, rehydrate by drinking water, juice, or clear broth. General instructions If you have sleep apnea, surgery and certain medicines can increase your risk for breathing problems. Follow instructions from your health care provider about wearing your sleep device: Anytime you are sleeping, including during daytime naps. While taking prescription pain medicines, sleeping medicines, or medicines that make you drowsy. Have a responsible adult stay with you for the time you are told. It is important to have someone help care for you until you are awake and alert. Return to your normal activities as told by your health care provider. Ask your health care provider what activities are safe for you. Take over-the-counter and prescription medicines only as told by your health care provider. If you smoke, do not smoke without supervision. Keep all follow-up visits as told by your health care provider. This is important. Contact a health care provider if: You have nausea or vomiting that does not get better with medicine. You cannot eat or drink without vomiting. You have pain that does not get better with medicine. You are unable to pass urine. You develop a skin rash. You have a fever. You have redness around your IV site that gets worse. Get help right away if: You have difficulty breathing. You have chest pain. You have blood in your urine or stool, or you vomit blood. Summary After the procedure, it is common to have a sore throat or nausea. It is also common to feel tired. Have a responsible adult stay with you for the time you are told. It is important to have someone help care for you until you are awake and alert. When you feel hungry, start by eating small amounts of foods that are soft and easy to digest (bland), such as toast. Gradually return to your regular diet. Drink enough fluid to keep your urine pale yellow. Return  to your normal activities as told by your health care provider. Ask your health care provider what activities are safe for you. This information is not intended to replace advice given to you by your health care provider. Make sure you discuss any questions you have with your health care provider. Document Revised: 11/05/2019 Document Reviewed: 06/04/2019 Elsevier Patient Education  2022 [employer] How to Use Chlorhexidine for Bathing Chlorhexidine gluconate (CHG) is a germ-killing (antiseptic) solution that is used to clean the skin. It can get rid of  the bacteria that normally live on the skin and can keep them away for about 24 hours. To clean your skin with CHG, you may be given: A CHG solution to use in the shower or as part of a sponge bath. A prepackaged cloth that contains CHG. Cleaning your skin with CHG may help lower the risk for infection: While you are staying in the intensive care unit of the hospital. If you have a vascular access, such as a central line, to provide short-term or long-term access to your veins. If you have a catheter to drain urine from your bladder. If you are on a ventilator. A ventilator is a machine that helps you breathe by moving air in and out of your lungs. After surgery. What are the risks? Risks of using CHG include: A skin reaction. Hearing loss, if CHG gets in your ears and you have a perforated eardrum. Eye injury, if CHG gets in your eyes and is not rinsed out. The CHG product catching fire. Make sure that you avoid smoking and flames after applying CHG to your skin. Do not use CHG: If you have a chlorhexidine allergy or have previously reacted to chlorhexidine. On babies younger than 61 months of age. How to use CHG solution Use CHG only as told by your health care provider, and follow the instructions on the label. Use the full amount of CHG as directed. Usually, this is one bottle. During a shower Follow these steps when using CHG  solution during a shower (unless your health care provider gives you different instructions): Start the shower. Use your normal soap and shampoo to wash your face and hair. Turn off the shower or move out of the shower stream. Pour the CHG onto a clean washcloth. Do not use any type of brush or rough-edged sponge. Starting at your neck, lather your body down to your toes. Make sure you follow these instructions: If you will be having surgery, pay special attention to the part of your body where you will be having surgery. Scrub this area for at least 1 minute. Do not use CHG on your head or face. If the solution gets into your ears or eyes, rinse them well with water. Avoid your genital area. Avoid any areas of skin that have broken skin, cuts, or scrapes. Scrub your back and under your arms. Make sure to wash skin folds. Let the lather sit on your skin for 1-2 minutes or as long as told by your health care provider. Thoroughly rinse your entire body in the shower. Make sure that all body creases and crevices are rinsed well. Dry off with a clean towel. Do not put any substances on your body afterward--such as powder, lotion, or perfume--unless you are told to do so by your health care provider. Only use lotions that are recommended by the manufacturer. Put on clean clothes or pajamas. If it is the night before your surgery, sleep in clean sheets.  During a sponge bath Follow these steps when using CHG solution during a sponge bath (unless your health care provider gives you different instructions): Use your normal soap and shampoo to wash your face and hair. Pour the CHG onto a clean washcloth. Starting at your neck, lather your body down to your toes. Make sure you follow these instructions: If you will be having surgery, pay special attention to the part of your body where you will be having surgery. Scrub this area for at least 1 minute. Do not use  CHG on your head or face. If the solution  gets into your ears or eyes, rinse them well with water. Avoid your genital area. Avoid any areas of skin that have broken skin, cuts, or scrapes. Scrub your back and under your arms. Make sure to wash skin folds. Let the lather sit on your skin for 1-2 minutes or as long as told by your health care provider. Using a different clean, wet washcloth, thoroughly rinse your entire body. Make sure that all body creases and crevices are rinsed well. Dry off with a clean towel. Do not put any substances on your body afterward--such as powder, lotion, or perfume--unless you are told to do so by your health care provider. Only use lotions that are recommended by the manufacturer. Put on clean clothes or pajamas. If it is the night before your surgery, sleep in clean sheets. How to use CHG prepackaged cloths Only use CHG cloths as told by your health care provider, and follow the instructions on the label. Use the CHG cloth on clean, dry skin. Do not use the CHG cloth on your head or face unless your health care provider tells you to. When washing with the CHG cloth: Avoid your genital area. Avoid any areas of skin that have broken skin, cuts, or scrapes. Before surgery Follow these steps when using a CHG cloth to clean before surgery (unless your health care provider gives you different instructions): Using the CHG cloth, vigorously scrub the part of your body where you will be having surgery. Scrub using a back-and-forth motion for 3 minutes. The area on your body should be completely wet with CHG when you are done scrubbing. Do not rinse. Discard the cloth and let the area air-dry. Do not put any substances on the area afterward, such as powder, lotion, or perfume. Put on clean clothes or pajamas. If it is the night before your surgery, sleep in clean sheets.  For general bathing Follow these steps when using CHG cloths for general bathing (unless your health care provider gives you different  instructions). Use a separate CHG cloth for each area of your body. Make sure you wash between any folds of skin and between your fingers and toes. Wash your body in the following order, switching to a new cloth after each step: The front of your neck, shoulders, and chest. Both of your arms, under your arms, and your hands. Your stomach and groin area, avoiding the genitals. Your right leg and foot. Your left leg and foot. The back of your neck, your back, and your buttocks. Do not rinse. Discard the cloth and let the area air-dry. Do not put any substances on your body afterward--such as powder, lotion, or perfume--unless you are told to do so by your health care provider. Only use lotions that are recommended by the manufacturer. Put on clean clothes or pajamas. Contact a health care provider if: Your skin gets irritated after scrubbing. You have questions about using your solution or cloth. You swallow any chlorhexidine. Call your local poison control center (1-3140582998 in the [place] Get help right away if: Your eyes itch badly, or they become very red or swollen. Your skin itches badly and is red or swollen. Your hearing changes. You have trouble seeing. You have swelling or tingling in your mouth or throat. You have trouble breathing. These symptoms may represent a serious problem that is an emergency. Do not wait to see if the symptoms will go away. Get medical help right  away. Call your local emergency services (911 in the [place] Do not drive yourself to the hospital. Summary Chlorhexidine gluconate (CHG) is a germ-killing (antiseptic) solution that is used to clean the skin. Cleaning your skin with CHG may help to lower your risk for infection. You may be given CHG to use for bathing. It may be in a bottle or in a prepackaged cloth to use on your skin. Carefully follow your health care provider's instructions and the instructions on the product label. Do not use CHG if you have  a chlorhexidine allergy. Contact your health care provider if your skin gets irritated after scrubbing. This information is not intended to replace advice given to you by your health care provider. Make sure you discuss any questions you have with your health care provider. Document Revised: 05/02/2020 Document Reviewed: 05/02/2020 Elsevier Patient Education  2022 [employer].

## 2021-01-11 NOTE — H&P (Signed)
[facility] History and Physical  Reason for Referral: Gallstones  Referring Physician: Benita Stabile, MD   Chief Complaint   New Patient (Initial Visit)     Frances [name] is a 20 y.o. female.  HPI: Frances [name] is a 20 yo who has been having RUQ pain that is sharp in nature and some nausea that has been getting worse over the past month. The episodes are getting more intense and are not really associated with food at this point. She says it is keeping her from helping with her kids at times. The episodes have been going on for at least 3 months, and she has pretty much decreased her diet and does not eat because of the nausea. She says now the episodes are random in nature and she even had to go to the ED for an episode.  She has a 78 month old and a 58.21 year old.    Past Medical History:  Diagnosis Date   ADHD (attention deficit hyperactivity disorder)    Allergy    Depressed    Gallstones    ODD (oppositional defiant disorder)    Post traumatic stress disorder    PTSD (post-traumatic stress disorder)     Past Surgical History:  Procedure Laterality Date   APPENDECTOMY     LAPAROSCOPIC APPENDECTOMY N/A 01/18/2013   Procedure: APPENDECTOMY LAPAROSCOPIC;  Surgeon: Judie Petit. Leonia Corona, MD;  Location: [facility] OR;  Service: Pediatrics;  Laterality: N/A;   TONSILLECTOMY      Family History  Problem Relation Age of Onset   Cancer Maternal Grandmother    Heart disease Maternal Grandmother    Cancer Maternal Grandfather    Diabetes Maternal Grandfather    Heart disease Maternal Grandfather    Cancer Paternal Grandmother    Heart disease Paternal Grandmother    Cancer Paternal Grandfather    Heart disease Paternal Grandfather     Social History   Tobacco Use   Smoking status: Never    Passive exposure: Yes   Smokeless tobacco: Never  Vaping Use   Vaping Use: Every day  Substance Use Topics   Alcohol use: Yes    Comment: occ   Drug use: No     Medications: I have reviewed the patient's current medications. Allergies as of 01/10/2021       Reactions   Risperidone And Related Hives   Shellfish Allergy Hives, Nausea And Vomiting, Swelling        Medication List        Accurate as of January 10, 2021  9:13 AM. If you have any questions, ask your nurse or doctor.          STOP taking these medications    acetaminophen 325 MG tablet Commonly known as: Tylenol Stopped by: Lucretia Roers, MD   docusate sodium 100 MG capsule Commonly known as: COLACE Stopped by: Lucretia Roers, MD   ibuprofen 600 MG tablet Commonly known as: ADVIL Stopped by: Lucretia Roers, MD       TAKE these medications    pantoprazole 40 MG tablet Commonly known as: PROTONIX Take 1 tablet (40 mg total) by mouth daily.   traMADol 50 MG tablet Commonly known as: ULTRAM Take 1 tablet (50 mg total) by mouth every 6 (six) hours as needed.         ROS:  A comprehensive review of systems was negative except for: Gastrointestinal: positive for abdominal pain and nausea Reflux symptoms in chest   Blood  pressure 113/79, pulse 86, temperature 98.6 F (37 C), temperature source Other (Comment), resp. rate 14, height 5\' 3"  (1.6 m), weight 158 lb (71.7 kg), SpO2 97 %, not currently breastfeeding. Physical Exam Vitals reviewed.  Constitutional:      Appearance: Normal appearance.  HENT:     Head: Normocephalic.     Mouth/Throat:     Mouth: Mucous membranes are moist.  Eyes:     Extraocular Movements: Extraocular movements intact.  Cardiovascular:     Rate and Rhythm: Normal rate and regular rhythm.  Pulmonary:     Effort: Pulmonary effort is normal.     Breath sounds: Normal breath sounds.  Abdominal:     General: There is no distension.     Palpations: Abdomen is soft.     Tenderness: There is abdominal tenderness in the right upper quadrant.  Musculoskeletal:        General: Normal range of motion.     Cervical  back: Normal range of motion.  Skin:    General: Skin is warm.  Neurological:     General: No focal deficit present.     Mental Status: She is alert and oriented to person, place, and time.  Psychiatric:        Mood and Affect: Mood normal.    Results: CLINICAL DATA:  Right upper quadrant pain and nausea x2 weeks.   EXAM: ULTRASOUND ABDOMEN LIMITED RIGHT UPPER QUADRANT   COMPARISON:  None.   FINDINGS: Gallbladder:   Numerous layering cholelithiasis measuring up to 1 cm. No pericholecystic fluid or wall thickening visualized. No sonographic Murphy sign noted by sonographer.   Common bile duct:   Diameter: 4 mm   Liver:   No focal lesion identified. Within normal limits in parenchymal echogenicity. Portal vein is patent on color Doppler imaging with normal direction of blood flow towards the liver.   Other: None.   IMPRESSION: Cholelithiasis without sonographic evidence of acute cholecystitis.     Electronically Signed   By: Dahlia Bailiff M.D.   On: 12/19/2020 18:42   Assessment & Plan:  Frances [name] is a 20 y.o. female with gallstone that are symptomatic.   PLAN: I counseled the patient about the indication, risks and benefits of laparoscopic cholecystectomy.  She understands there is a very small chance for bleeding, infection, injury to normal structures (including common bile duct), conversion to open surgery, persistent symptoms, evolution of postcholecystectomy diarrhea, need for secondary interventions, anesthesia reaction, cardiopulmonary issues and other risks not specifically detailed here. I described the expected recovery, the plan for follow-up and the restrictions during the recovery phase.  All questions were answered.   All questions were answered to the satisfaction of the patient.    Virl Cagey 01/10/2021, 9:13 AM

## 2021-01-12 ENCOUNTER — Encounter (HOSPITAL_COMMUNITY)
Admission: RE | Admit: 2021-01-12 | Discharge: 2021-01-12 | Disposition: A | Discharge status: Home / Self Care | Payer: 59 | Source: Ambulatory Visit | Attending: General Surgery | Admitting: General Surgery

## 2021-01-12 ENCOUNTER — Other Ambulatory Visit: Payer: Self-pay

## 2021-01-12 DIAGNOSIS — Z01812 Encounter for preprocedural laboratory examination: Secondary | ICD-10-CM | POA: Diagnosis not present

## 2021-01-12 DIAGNOSIS — Z01818 Encounter for other preprocedural examination: Secondary | ICD-10-CM

## 2021-01-12 LAB — PREGNANCY, URINE: Preg Test, Ur: NEGATIVE

## 2021-01-13 ENCOUNTER — Encounter (HOSPITAL_COMMUNITY)
Admission: RE | Disposition: A | Discharge status: Home / Self Care | Payer: Self-pay | Source: Home / Self Care | Attending: General Surgery

## 2021-01-13 ENCOUNTER — Ambulatory Visit (HOSPITAL_COMMUNITY)
Admission: RE | Admit: 2021-01-13 | Discharge: 2021-01-13 | Disposition: A | Discharge status: Home / Self Care | Payer: 59 | Attending: General Surgery | Admitting: General Surgery

## 2021-01-13 ENCOUNTER — Ambulatory Visit (HOSPITAL_COMMUNITY): Payer: 59 | Admitting: Anesthesiology

## 2021-01-13 ENCOUNTER — Encounter (HOSPITAL_COMMUNITY): Payer: Self-pay | Admitting: General Surgery

## 2021-01-13 DIAGNOSIS — K8010 Calculus of gallbladder with chronic cholecystitis without obstruction: Secondary | ICD-10-CM | POA: Diagnosis not present

## 2021-01-13 DIAGNOSIS — Z01818 Encounter for other preprocedural examination: Secondary | ICD-10-CM

## 2021-01-13 DIAGNOSIS — K8020 Calculus of gallbladder without cholecystitis without obstruction: Secondary | ICD-10-CM | POA: Diagnosis present

## 2021-01-13 DIAGNOSIS — K219 Gastro-esophageal reflux disease without esophagitis: Secondary | ICD-10-CM | POA: Diagnosis not present

## 2021-01-13 HISTORY — PX: CHOLECYSTECTOMY: SHX55

## 2021-01-13 SURGERY — LAPAROSCOPIC CHOLECYSTECTOMY
Anesthesia: General | Site: Abdomen

## 2021-01-13 MED ORDER — CHLORHEXIDINE GLUCONATE 0.12 % MT SOLN
15.0000 mL | Freq: Once | OROMUCOSAL | Status: AC
  Administered 2021-01-13: 15 mL via OROMUCOSAL

## 2021-01-13 MED ORDER — OXYCODONE HCL 5 MG PO TABS: 5.0000 mg | ORAL_TABLET | ORAL | 0 refills | Status: AC | PRN

## 2021-01-13 MED ORDER — HEMOSTATIC AGENTS (NO CHARGE) OPTIME
TOPICAL | Status: DC | PRN
  Administered 2021-01-13: 1

## 2021-01-13 MED ORDER — HYDROMORPHONE HCL 1 MG/ML IJ SOLN
0.2500 mg | INTRAMUSCULAR | Status: DC | PRN
  Administered 2021-01-13: 0.5 mg via INTRAVENOUS
  Filled 2021-01-13: qty 0.5

## 2021-01-13 MED ORDER — BUPIVACAINE HCL (PF) 0.5 % IJ SOLN
INTRAMUSCULAR | Status: DC | PRN
  Administered 2021-01-13: 10 mL

## 2021-01-13 MED ORDER — ONDANSETRON HCL 4 MG/2ML IJ SOLN
INTRAMUSCULAR | Status: DC | PRN
  Administered 2021-01-13: 4 mg via INTRAVENOUS

## 2021-01-13 MED ORDER — DEXAMETHASONE SODIUM PHOSPHATE 10 MG/ML IJ SOLN
INTRAMUSCULAR | Status: AC
  Filled 2021-01-13: qty 1

## 2021-01-13 MED ORDER — MIDAZOLAM HCL 2 MG/2ML IJ SOLN
INTRAMUSCULAR | Status: AC
  Filled 2021-01-13: qty 2

## 2021-01-13 MED ORDER — FENTANYL CITRATE (PF) 100 MCG/2ML IJ SOLN
INTRAMUSCULAR | Status: DC | PRN
  Administered 2021-01-13: 50 ug via INTRAVENOUS
  Administered 2021-01-13 (×2): 100 ug via INTRAVENOUS

## 2021-01-13 MED ORDER — DEXAMETHASONE SODIUM PHOSPHATE 10 MG/ML IJ SOLN
INTRAMUSCULAR | Status: DC | PRN
  Administered 2021-01-13: 10 mg via INTRAVENOUS

## 2021-01-13 MED ORDER — ONDANSETRON HCL 4 MG/2ML IJ SOLN: 4.0000 mg | Freq: Once | INTRAMUSCULAR | Status: DC | PRN

## 2021-01-13 MED ORDER — LIDOCAINE HCL (PF) 2 % IJ SOLN
INTRAMUSCULAR | Status: AC
  Filled 2021-01-13: qty 5

## 2021-01-13 MED ORDER — MIDAZOLAM HCL 2 MG/2ML IJ SOLN
INTRAMUSCULAR | Status: DC | PRN
  Administered 2021-01-13: 2 mg via INTRAVENOUS

## 2021-01-13 MED ORDER — FENTANYL CITRATE (PF) 250 MCG/5ML IJ SOLN
INTRAMUSCULAR | Status: AC
  Filled 2021-01-13: qty 5

## 2021-01-13 MED ORDER — ORAL CARE MOUTH RINSE: 15.0000 mL | Freq: Once | OROMUCOSAL | Status: AC

## 2021-01-13 MED ORDER — LIDOCAINE HCL (CARDIAC) PF 50 MG/5ML IV SOSY
PREFILLED_SYRINGE | INTRAVENOUS | Status: DC | PRN
  Administered 2021-01-13: 30 mg via INTRAVENOUS

## 2021-01-13 MED ORDER — SODIUM CHLORIDE 0.9 % IR SOLN
Status: DC | PRN
  Administered 2021-01-13: 1000 mL

## 2021-01-13 MED ORDER — PROPOFOL 10 MG/ML IV BOLUS
INTRAVENOUS | Status: DC | PRN
  Administered 2021-01-13: 200 mg via INTRAVENOUS

## 2021-01-13 MED ORDER — SUGAMMADEX SODIUM 500 MG/5ML IV SOLN
INTRAVENOUS | Status: DC | PRN
  Administered 2021-01-13: 200 mg via INTRAVENOUS

## 2021-01-13 MED ORDER — MEPERIDINE HCL 50 MG/ML IJ SOLN: 6.2500 mg | INTRAMUSCULAR | Status: DC | PRN

## 2021-01-13 MED ORDER — SODIUM CHLORIDE 0.9 % IV SOLN
2.0000 g | INTRAVENOUS | Status: AC
  Administered 2021-01-13: 2 g via INTRAVENOUS
  Filled 2021-01-13: qty 2

## 2021-01-13 MED ORDER — ONDANSETRON HCL 4 MG PO TABS: 4.0000 mg | ORAL_TABLET | Freq: Three times a day (TID) | ORAL | 1 refills | Status: AC | PRN

## 2021-01-13 MED ORDER — BUPIVACAINE HCL (PF) 0.5 % IJ SOLN
INTRAMUSCULAR | Status: AC
  Filled 2021-01-13: qty 30

## 2021-01-13 MED ORDER — CHLORHEXIDINE GLUCONATE CLOTH 2 % EX PADS: 6.0000 | MEDICATED_PAD | Freq: Once | CUTANEOUS | Status: DC

## 2021-01-13 MED ORDER — ROCURONIUM BROMIDE 100 MG/10ML IV SOLN
INTRAVENOUS | Status: DC | PRN
  Administered 2021-01-13: 50 mg via INTRAVENOUS

## 2021-01-13 MED ORDER — KETOROLAC TROMETHAMINE 30 MG/ML IJ SOLN
30.0000 mg | Freq: Once | INTRAMUSCULAR | Status: AC
  Administered 2021-01-13: 30 mg via INTRAVENOUS
  Filled 2021-01-13: qty 1

## 2021-01-13 MED ORDER — PROPOFOL 10 MG/ML IV BOLUS
INTRAVENOUS | Status: AC
  Filled 2021-01-13: qty 20

## 2021-01-13 MED ORDER — PROPOFOL 10 MG/ML IV BOLUS
INTRAVENOUS | Status: AC
  Filled 2021-01-13: qty 40

## 2021-01-13 MED ORDER — SCOPOLAMINE 1 MG/3DAYS TD PT72
1.0000 | MEDICATED_PATCH | Freq: Once | TRANSDERMAL | Status: DC
  Administered 2021-01-13: 1.5 mg via TRANSDERMAL
  Filled 2021-01-13: qty 1

## 2021-01-13 MED ORDER — ONDANSETRON HCL 4 MG/2ML IJ SOLN
INTRAMUSCULAR | Status: AC
  Filled 2021-01-13: qty 2

## 2021-01-13 MED ORDER — LACTATED RINGERS IV SOLN: INTRAVENOUS | Status: DC

## 2021-01-13 MED ORDER — ROCURONIUM BROMIDE 10 MG/ML (PF) SYRINGE
PREFILLED_SYRINGE | INTRAVENOUS | Status: AC
  Filled 2021-01-13: qty 10

## 2021-01-13 SURGICAL SUPPLY — 41 items
ADH SKN CLS APL DERMABOND .7 (GAUZE/BANDAGES/DRESSINGS) ×1
APL PRP STRL LF DISP 70% ISPRP (MISCELLANEOUS) ×1
APPLIER CLIP ROT 10 11.4 M/L (STAPLE) ×2
APR CLP MED LRG 11.4X10 (STAPLE) ×1
BLADE SURG 15 STRL LF DISP TIS (BLADE) ×1 IMPLANT
BLADE SURG 15 STRL SS (BLADE) ×2
CHLORAPREP W/TINT 26 (MISCELLANEOUS) ×2 IMPLANT
CLIP APPLIE ROT 10 11.4 M/L (STAPLE) ×1 IMPLANT
CLOTH BEACON ORANGE TIMEOUT ST (SAFETY) ×2 IMPLANT
COVER LIGHT HANDLE STERIS (MISCELLANEOUS) ×4 IMPLANT
DERMABOND ADVANCED (GAUZE/BANDAGES/DRESSINGS) ×1
DERMABOND ADVANCED .7 DNX12 (GAUZE/BANDAGES/DRESSINGS) ×1 IMPLANT
ELECT REM PT RETURN 9FT ADLT (ELECTROSURGICAL) ×2
ELECTRODE REM PT RTRN 9FT ADLT (ELECTROSURGICAL) ×1 IMPLANT
GAUZE 4X4 16PLY ~~LOC~~+RFID DBL (SPONGE) ×4 IMPLANT
GLOVE SURG ENC MOIS LTX SZ6.5 (GLOVE) ×2 IMPLANT
GLOVE SURG POLYISO LF SZ7 (GLOVE) ×4 IMPLANT
GLOVE SURG UNDER POLY LF SZ7 (GLOVE) ×8 IMPLANT
GOWN STRL REUS W/TWL LRG LVL3 (GOWN DISPOSABLE) ×6 IMPLANT
HEMOSTAT SNOW SURGICEL 2X4 (HEMOSTASIS) ×2 IMPLANT
INST SET LAPROSCOPIC AP (KITS) ×2 IMPLANT
KIT TURNOVER KIT A (KITS) ×2 IMPLANT
MANIFOLD NEPTUNE II (INSTRUMENTS) ×2 IMPLANT
NEEDLE HYPO 18GX1.5 BLUNT FILL (NEEDLE) ×2 IMPLANT
NEEDLE INSUFFLATION 14GA 120MM (NEEDLE) ×2 IMPLANT
NS IRRIG 1000ML POUR BTL (IV SOLUTION) ×2 IMPLANT
PACK LAP CHOLE LZT030E (CUSTOM PROCEDURE TRAY) ×2 IMPLANT
PAD ARMBOARD 7.5X6 YLW CONV (MISCELLANEOUS) ×2 IMPLANT
SET BASIN LINEN APH (SET/KITS/TRAYS/PACK) ×2 IMPLANT
SET TUBE SMOKE EVAC HIGH FLOW (TUBING) ×2 IMPLANT
SLEEVE ENDOPATH XCEL 5M (ENDOMECHANICALS) ×2 IMPLANT
SUT MNCRL AB 4-0 PS2 18 (SUTURE) ×2 IMPLANT
SUT VICRYL 0 UR6 27IN ABS (SUTURE) ×2 IMPLANT
SYR 30ML LL (SYRINGE) ×2 IMPLANT
SYS BAG RETRIEVAL 10MM (BASKET) ×2
SYSTEM BAG RETRIEVAL 10MM (BASKET) ×1 IMPLANT
TROCAR ENDO BLADELESS 11MM (ENDOMECHANICALS) ×2 IMPLANT
TROCAR XCEL NON-BLD 5MMX100MML (ENDOMECHANICALS) ×2 IMPLANT
TROCAR XCEL UNIV SLVE 11M 100M (ENDOMECHANICALS) ×2 IMPLANT
TUBE CONNECTING 12X1/4 (SUCTIONS) ×2 IMPLANT
WARMER LAPAROSCOPE (MISCELLANEOUS) ×2 IMPLANT

## 2021-01-13 NOTE — Anesthesia Procedure Notes (Signed)
Procedure Name: Intubation Date/Time: 01/13/2021 7:41 AM Performed by: Vista Deck, CRNA Pre-anesthesia Checklist: Patient identified, Patient being monitored, Timeout performed, Emergency Drugs available and Suction available Patient Re-evaluated:Patient Re-evaluated prior to induction Oxygen Delivery Method: Circle system utilized Preoxygenation: Pre-oxygenation with 100% oxygen Induction Type: IV induction Ventilation: Mask ventilation without difficulty Laryngoscope Size: Mac and 3 Grade View: Grade I Tube type: Oral Tube size: 7.0 mm Number of attempts: 1 Airway Equipment and Method: Stylet Placement Confirmation: ETT inserted through vocal cords under direct vision, positive ETCO2 and breath sounds checked- equal and bilateral Secured at: 21 cm Tube secured with: Tape Dental Injury: Teeth and Oropharynx as per pre-operative assessment

## 2021-01-13 NOTE — Transfer of Care (Signed)
Immediate Anesthesia Transfer of Care Note  Patient: Tarnesha Ulloa  Procedure(s) Performed: LAPAROSCOPIC CHOLECYSTECTOMY (Abdomen)  Patient Location: PACU  Anesthesia Type:General  Level of Consciousness: awake and patient cooperative  Airway & Oxygen Therapy: Patient Spontanous Breathing and Patient connected to nasal cannula oxygen  Post-op Assessment: Report given to RN and Post -op Vital signs reviewed and stable  Post vital signs: Reviewed and stable  Last Vitals:  Vitals Value Taken Time  BP 100/79 01/13/21 0830  Temp    Pulse 68 01/13/21 0831  Resp 10 01/13/21 0831  SpO2 94 % 01/13/21 0831  Vitals shown include unvalidated device data.  Last Pain:  Vitals:   01/13/21 0628  TempSrc: Oral  PainSc: 5          Complications: No notable events documented.

## 2021-01-13 NOTE — Discharge Instructions (Signed)
Discharge Laparoscopic Surgery Instructions:  Common Complaints: Right shoulder pain is common after laparoscopic surgery. This is secondary to the gas used in the surgery being trapped under the diaphragm.  Walk to help your body absorb the gas. This will improve in a few days. Pain at the port sites are common, especially the larger port sites. This will improve with time.  Some nausea is common and poor appetite. The main goal is to stay hydrated the first few days after surgery.   Diet/ Activity: Diet as tolerated. You may not have an appetite, but it is important to stay hydrated. Drink 64 ounces of water a day. Your appetite will return with time.  Shower per your regular routine daily.  Do not take hot showers. Take warm showers that are less than 10 minutes. Rest and listen to your body, but do not remain in bed all day.  Walk everyday for at least 15-20 minutes. Deep cough and move around every 1-2 hours in the first few days after surgery.  Do not lift > 10 lbs, perform excessive bending, pushing, pulling, squatting for 1-2 weeks after surgery.  Do not pick at the dermabond glue on your incision sites.  This glue film will remain in place for 1-2 weeks and will start to peel off.  Do not place lotions or balms on your incision unless instructed to specifically by Dr. Michaeleen Down.   Pain Expectations and Narcotics: -After surgery you will have pain associated with your incisions and this is normal. The pain is muscular and nerve pain, and will get better with time. -You are encouraged and expected to take non narcotic medications like tylenol and ibuprofen (when able) to treat pain as multiple modalities can aid with pain treatment. -Narcotics are only used when pain is severe or there is breakthrough pain. -You are not expected to have a pain score of 0 after surgery, as we cannot prevent pain. A pain score of 3-4 that allows you to be functional, move, walk, and tolerate some activity is  the goal. The pain will continue to improve over the days after surgery and is dependent on your surgery. -Due to [place] law, we are only able to give a certain amount of pain medication to treat post operative pain, and we only give additional narcotics on a patient by patient basis.  -For most laparoscopic surgery, studies have shown that the majority of patients only need 10-15 narcotic pills, and for open surgeries most patients only need 15-20.   -Having appropriate expectations of pain and knowledge of pain management with non narcotics is important as we do not want anyone to become addicted to narcotic pain medication.  -Using ice packs in the first 48 hours and heating pads after 48 hours, wearing an abdominal binder (when recommended), and using over the counter medications are all ways to help with pain management.   -Simple acts like meditation and mindfulness practices after surgery can also help with pain control and research has proven the benefit of these practices.  Medication: Take tylenol and ibuprofen as needed for pain control, alternating every 4-6 hours.  Example:  Tylenol 1000mg @ 6am, 12noon, 6pm, 12midnight (Do not exceed 4000mg of tylenol a day). Ibuprofen 800mg @ 9am, 3pm, 9pm, 3am (Do not exceed 3600mg of ibuprofen a day).  Take Roxicodone for breakthrough pain every 4 hours.  Take Colace for constipation related to narcotic pain medication. If you do not have a bowel movement in 2 days, take Miralax   over the counter.  Drink plenty of water to also prevent constipation.   Contact Information: If you have questions or concerns, please call our office, [phone], Monday- Thursday 8AM-5PM and Friday 8AM-12Noon.  If it is after hours or on the weekend, please call [facility], [phone], [phone], and ask to speak to the surgeon on call for Dr. Ailyn Gladd at [place].   

## 2021-01-13 NOTE — Anesthesia Postprocedure Evaluation (Signed)
Anesthesia Post Note  Patient: Frances [name]  Procedure(s) Performed: LAPAROSCOPIC CHOLECYSTECTOMY (Abdomen)  Patient location during evaluation: PACU Anesthesia Type: General Level of consciousness: awake and alert and oriented Pain management: pain level controlled Vital Signs Assessment: post-procedure vital signs reviewed and stable Respiratory status: spontaneous breathing, nonlabored ventilation and respiratory function stable Cardiovascular status: blood pressure returned to baseline and stable Postop Assessment: no apparent nausea or vomiting Anesthetic complications: no   No notable events documented.   Last Vitals:  Vitals:   01/13/21 0915 01/13/21 0933  BP: 111/65 120/84  Pulse: 78 81  Resp: 16 16  Temp:  36.7 C  SpO2: 94% 99%    Last Pain:  Vitals:   01/13/21 0935  TempSrc:   PainSc: 5                  Alegandro Macnaughton C Koen Antilla

## 2021-01-13 NOTE — Interval H&P Note (Signed)
History and Physical Interval Note:  01/13/2021 7:18 AM  Frances [name]  has presented today for surgery, with the diagnosis of Cholelithiasis.  The various methods of treatment have been discussed with the patient and family. After consideration of risks, benefits and other options for treatment, the patient has consented to  Procedure(s): LAPAROSCOPIC CHOLECYSTECTOMY (N/A) as a surgical intervention.  The patient's history has been reviewed, patient examined, no change in status, stable for surgery.  I have reviewed the patient's chart and labs.  Questions were answered to the patient's satisfaction.     Lucretia Roers

## 2021-01-13 NOTE — Progress Notes (Signed)
[facility]  Discussed with mom. Rx sent to [facility]. Will do a post op phone call in a few weeks.  Algis Greenhouse, MD [facility] [address] [place], [place] [address] [phone] (office)

## 2021-01-13 NOTE — Anesthesia Preprocedure Evaluation (Addendum)
Anesthesia Evaluation  Patient identified by MRN, date of birth, ID band Patient awake    Reviewed: Allergy & Precautions, NPO status , Patient's Chart, lab work & pertinent test results  Airway Mallampati: II  TM Distance: >3 FB Neck ROM: Full    Dental  (+) Dental Advisory Given, Chipped   Pulmonary neg pulmonary ROS,    Pulmonary exam normal breath sounds clear to auscultation       Cardiovascular negative cardio ROS Normal cardiovascular exam Rhythm:Regular Rate:Normal     Neuro/Psych PSYCHIATRIC DISORDERS Anxiety Depression negative neurological ROS     GI/Hepatic Neg liver ROS, GERD  Medicated and Controlled,  Endo/Other  negative endocrine ROS  Renal/GU negative Renal ROS  negative genitourinary   Musculoskeletal negative musculoskeletal ROS (+)   Abdominal   Peds  (+) ADHD Hematology negative hematology ROS (+)   Anesthesia Other Findings   Reproductive/Obstetrics negative OB ROS                            Anesthesia Physical Anesthesia Plan  ASA: 2  Anesthesia Plan: General   Post-op Pain Management:    Induction: Intravenous  PONV Risk Score and Plan: 4 or greater and Ondansetron, Dexamethasone, Midazolam and Scopolamine patch - Pre-op  Airway Management Planned: Oral ETT  Additional Equipment:   Intra-op Plan:   Post-operative Plan: Extubation in OR  Informed Consent: I have reviewed the patients History and Physical, chart, labs and discussed the procedure including the risks, benefits and alternatives for the proposed anesthesia with the patient or authorized representative who has indicated his/her understanding and acceptance.     Dental advisory given  Plan Discussed with: CRNA and Surgeon  Anesthesia Plan Comments:         Anesthesia Quick Evaluation

## 2021-01-13 NOTE — Op Note (Signed)
Operative Note   Preoperative Diagnosis: Symptomatic cholelithiasis   Postoperative Diagnosis: Same   Procedure(s) Performed: Laparoscopic cholecystectomy   Surgeon: Lillia Abed C. Henreitta Leber, MD   Assistants: No qualified resident was available   Anesthesia: General endotracheal   Anesthesiologist: Molli Barrows, MD    Specimens: Gallbladder    Estimated Blood Loss: Minimal    Blood Replacement: None    Complications: None    Operative Findings: distended gallbladder    Procedure: The patient was taken to the operating room and placed supine. General endotracheal anesthesia was induced. Intravenous antibiotics were  administered per protocol. An orogastric tube positioned to decompress the stomach. The abdomen was prepared and draped in the usual sterile fashion.    A supraumbilical  incision was made and a Veress technique was utilized to achieve pneumoperitoneum to 15 mmHg with carbon dioxide. A 11 mm optiview port was placed through the supraumbilical region, and a 10 mm 0-degree operative laparoscope was introduced. The area underlying the trocar and Veress needle were inspected and without evidence of injury.  Remaining trocars were placed under direct vision. Two 5 mm ports were placed in the right abdomen, between the anterior axillary and midclavicular line.  A final 11 mm port was placed through the mid-epigastrium, near the falciform ligament.    The gallbladder fundus was elevated cephalad and the infundibulum was retracted to the patient's right. The gallbladder/cystic duct junction was skeletonized. The cystic artery noted in the triangle of Calot and was also skeletonized.  We then continued liberal medial and lateral dissection until the critical view of safety was achieved.    The cystic duct and cystic artery were doubly clipped and divided.  The mesentery posteriorly was bleeding and was triply clipped and divided. The common duct was clearly inferior and protected.  The gallbladder was then dissected from the liver bed with electrocautery. The specimen was placed in an Endopouch and was retrieved through the epigastric site.   Final inspection revealed acceptable hemostasis. Surgical SNOW was placed in the gallbladder bed.  Trocars were removed and pneumoperitoneum was released.  0 Vicryl fascial sutures were used to close the epigastric and umbilical port sites. Skin incisions were closed with 4-0 Monocryl subcuticular sutures and Dermabond. The patient was awakened from anesthesia and extubated without complication.    Algis Greenhouse, MD [facility] [address] [place], [place] [address] [phone] (office)

## 2021-01-16 ENCOUNTER — Encounter (HOSPITAL_COMMUNITY): Payer: Self-pay | Admitting: General Surgery

## 2021-01-16 LAB — SURGICAL PATHOLOGY

## 2021-01-31 ENCOUNTER — Ambulatory Visit (INDEPENDENT_AMBULATORY_CARE_PROVIDER_SITE_OTHER): Payer: 59 | Admitting: General Surgery

## 2021-01-31 ENCOUNTER — Other Ambulatory Visit: Payer: Self-pay

## 2021-01-31 DIAGNOSIS — K8020 Calculus of gallbladder without cholecystitis without obstruction: Secondary | ICD-10-CM

## 2021-01-31 NOTE — Progress Notes (Signed)
[facility]  I am calling the patient for post operative evaluation. This is not a billable encounter as it is under the global charges for the surgery.  The patient had a laparoscopic cholecystectomy on 01/13/2021. The patient reports that  is doing well. The are tolerating a diet, having good pain control, and having regular Bms.  The incisions are healing but she did have one open up and is apply a cream by her PCP. The patient has no concerns.   Pathology: FINAL MICROSCOPIC DIAGNOSIS:   A.GALLBLADDER, CHOLECYSTECOMY:  Chronic cholecystitis and cholelithiasis.  Negative for neoplasm.   Will see the patient PRN.   Algis Greenhouse, MD [facility] [address] [place], [place] [address] [phone] (office)

## 2021-09-06 DIAGNOSIS — Z01419 Encounter for gynecological examination (general) (routine) without abnormal findings: Secondary | ICD-10-CM | POA: Diagnosis not present

## 2021-09-06 DIAGNOSIS — Z0389 Encounter for observation for other suspected diseases and conditions ruled out: Secondary | ICD-10-CM | POA: Diagnosis not present

## 2021-09-08 DIAGNOSIS — F418 Other specified anxiety disorders: Secondary | ICD-10-CM | POA: Diagnosis not present

## 2021-09-08 DIAGNOSIS — R0600 Dyspnea, unspecified: Secondary | ICD-10-CM | POA: Diagnosis not present

## 2021-09-08 DIAGNOSIS — Z91013 Allergy to seafood: Secondary | ICD-10-CM | POA: Diagnosis not present

## 2021-11-13 DIAGNOSIS — Z0000 Encounter for general adult medical examination without abnormal findings: Secondary | ICD-10-CM | POA: Diagnosis not present

## 2021-11-13 DIAGNOSIS — R7301 Impaired fasting glucose: Secondary | ICD-10-CM | POA: Diagnosis not present

## 2022-03-05 NOTE — L&D Delivery Note (Signed)
OB/GYN Faculty Practice Delivery Note  Frances [name] is a 22 y.o. [id number] s/p SVD at [redacted]w[redacted]d. She was admitted for SOL.   ROM: rupture date, rupture time, delivery date, or delivery time have not been documented with clear fluid GBS Status:  Negative/-- (08/07 1350) Maximum Maternal Temperature: 98.67F  Labor Progress: Initial SVE: 3.5/90/-1. She then progressed to complete.   Delivery Date/Time: 1526 Delivery: Called to room and patient was complete and pushing. Head delivered LOA. Loose nuchal cord present. Shoulder and body delivered in usual fashion. Nuchal reduced. Infant with spontaneous cry, placed on mother's abdomen, dried and stimulated. Cord clamped x 2 after 1-minute delay, and cut by FOB. Cord blood drawn. Placenta delivered spontaneously with gentle cord traction. Fundus firm with massage and Pitocin. Labia, perineum, vagina, and cervix inspected without laceration. Mom and baby doing well.   Baby Weight: pending  Placenta: 3 vessel, intact. Sent to L&D Complications: None Lacerations: n/a EBL: 57 mL Analgesia: None   Infant:  APGAR (1 MIN): 8  APGAR (5 MINS):    Hessie Dibble, MD [facility] Fellow, [facility] for [facility], [facility] 10/23/2022, 3:43 PM

## 2022-04-06 ENCOUNTER — Other Ambulatory Visit: Payer: Self-pay | Admitting: Obstetrics & Gynecology

## 2022-04-06 DIAGNOSIS — O3680X0 Pregnancy with inconclusive fetal viability, not applicable or unspecified: Secondary | ICD-10-CM

## 2022-04-09 ENCOUNTER — Ambulatory Visit (INDEPENDENT_AMBULATORY_CARE_PROVIDER_SITE_OTHER): Payer: Medicaid Other

## 2022-04-09 DIAGNOSIS — O3680X0 Pregnancy with inconclusive fetal viability, not applicable or unspecified: Secondary | ICD-10-CM

## 2022-04-09 DIAGNOSIS — Z3A11 11 weeks gestation of pregnancy: Secondary | ICD-10-CM

## 2022-04-09 NOTE — Progress Notes (Signed)
[place] 10+2 wks,single IUP with yolk sac,CRL 34.05 mm,FHR 162 bpm,normal right ovary,simple left ovarian cyst 3.6 x 3.5 x 3.1 cm

## 2022-04-20 ENCOUNTER — Encounter: Payer: Self-pay | Admitting: Internal Medicine

## 2022-04-20 DIAGNOSIS — Z3490 Encounter for supervision of normal pregnancy, unspecified, unspecified trimester: Secondary | ICD-10-CM | POA: Insufficient documentation

## 2022-04-23 ENCOUNTER — Encounter: Payer: Medicaid Other | Admitting: *Deleted

## 2022-04-23 ENCOUNTER — Ambulatory Visit (INDEPENDENT_AMBULATORY_CARE_PROVIDER_SITE_OTHER): Payer: Medicaid Other | Admitting: Advanced Practice Midwife

## 2022-04-23 ENCOUNTER — Encounter: Payer: Self-pay | Admitting: Advanced Practice Midwife

## 2022-04-23 VITALS — BP 108/74 | HR 76 | Wt 169.0 lb

## 2022-04-23 DIAGNOSIS — Z3A12 12 weeks gestation of pregnancy: Secondary | ICD-10-CM | POA: Diagnosis not present

## 2022-04-23 DIAGNOSIS — Z3481 Encounter for supervision of other normal pregnancy, first trimester: Secondary | ICD-10-CM

## 2022-04-23 DIAGNOSIS — F332 Major depressive disorder, recurrent severe without psychotic features: Secondary | ICD-10-CM

## 2022-04-23 DIAGNOSIS — Z3480 Encounter for supervision of other normal pregnancy, unspecified trimester: Secondary | ICD-10-CM | POA: Diagnosis not present

## 2022-04-23 DIAGNOSIS — Z363 Encounter for antenatal screening for malformations: Secondary | ICD-10-CM

## 2022-04-23 MED ORDER — BLOOD PRESSURE MONITOR MISC: 0 refills | Status: DC

## 2022-04-23 MED ORDER — ONDANSETRON 4 MG PO TBDP: 4.0000 mg | ORAL_TABLET | Freq: Four times a day (QID) | ORAL | 2 refills | Status: DC | PRN

## 2022-04-23 MED ORDER — DOXYLAMINE-PYRIDOXINE 10-10 MG PO TBEC: DELAYED_RELEASE_TABLET | ORAL | 6 refills | Status: DC

## 2022-04-23 NOTE — Patient Instructions (Signed)
Frances [name], I greatly value your feedback.  If you receive a survey following your visit with [place] today, we appreciate you taking the time to fill it out.  Thanks, Frances Berthold, DNP, [place]!!! It is now [facility] ([place], [place] [address]) Entrance located off of [address] parking   Nausea & Vomiting Have saltine crackers or pretzels by your bed and eat a few bites before you raise your head out of bed in the morning Eat small frequent meals throughout the day instead of large meals Drink plenty of fluids throughout the day to stay hydrated, just don't drink a lot of fluids with your meals.  This can make your stomach fill up faster making you feel sick Do not brush your teeth right after you eat Products with real ginger are good for nausea, like ginger ale and ginger hard candy Make sure it says made with real ginger! Sucking on sour candy like lemon heads is also good for nausea If your prenatal vitamins make you nauseated, take them at night so you will sleep through the nausea Sea Bands If you feel like you need medicine for the nausea & vomiting please let us know If you are unable to keep any fluids or food down please let us know   Constipation Drink plenty of fluid, preferably water, throughout the day Eat foods high in fiber such as fruits, vegetables, and grains Exercise, such as walking, is a good way to keep your bowels regular Drink warm fluids, especially warm prune juice, or decaf coffee Eat a 1/2 cup of real oatmeal (not instant), 1/2 cup applesauce, and 1/2-1 cup warm prune juice every day If needed, you may take Colace (docusate sodium) stool softener once or twice a day to help keep the stool soft.  If you still are having problems with constipation, you may take Miralax once daily as needed to help keep your bowels regular.   Home Blood Pressure Monitoring for  Patients   Your provider has recommended that you check your blood pressure (BP) at least once a week at home. If you do not have a blood pressure cuff at home, one will be provided for you. Contact your provider if you have not received your monitor within 1 week.   Helpful Tips for Accurate Home Blood Pressure Checks  Don't smoke, exercise, or drink caffeine 30 minutes before checking your BP Use the restroom before checking your BP (a full bladder can raise your pressure) Relax in a comfortable upright chair Feet on the ground Left arm resting comfortably on a flat surface at the level of your heart Legs uncrossed Back supported Sit quietly and don't talk Place the cuff on your bare arm Adjust snuggly, so that only two fingertips can fit between your skin and the top of the cuff Check 2 readings separated by at least one minute Keep a log of your BP readings For a visual, please reference this diagram: http://ccnc.care/bpdiagram  Provider Name: [facility]     Phone: [phone]  Zone 1: ALL CLEAR  Continue to monitor your symptoms:  BP reading is less than 140 (top number) or less than 90 (bottom number)  No right upper stomach pain No headaches or seeing spots No feeling nauseated or throwing up No swelling in face and hands  Zone 2: CAUTION Call your doctor's office for any of the following:  BP  reading is greater than 140 (top number) or greater than 90 (bottom number)  Stomach pain under your ribs in the middle or right side Headaches or seeing spots Feeling nauseated or throwing up Swelling in face and hands  Zone 3: EMERGENCY  Seek immediate medical care if you have any of the following:  BP reading is greater than160 (top number) or greater than 110 (bottom number) Severe headaches not improving with Tylenol Serious difficulty catching your breath Any worsening symptoms from Zone 2    First Trimester of Pregnancy The first trimester of pregnancy is from  week 1 until the end of week 12 (months 1 through 3). A week after a sperm fertilizes an egg, the egg will implant on the wall of the uterus. This embryo will begin to develop into a baby. Genes from you and your partner are forming the baby. The female genes determine whether the baby is a boy or a girl. At 6-8 weeks, the eyes and face are formed, and the heartbeat can be seen on ultrasound. At the end of 12 weeks, all the baby's organs are formed.  Now that you are pregnant, you will want to do everything you can to have a healthy baby. Two of the most important things are to get good prenatal care and to follow your health care provider's instructions. Prenatal care is all the medical care you receive before the baby's birth. This care will help prevent, find, and treat any problems during the pregnancy and childbirth. BODY CHANGES Your body goes through many changes during pregnancy. The changes vary from woman to woman.  You may gain or lose a couple of pounds at first. You may feel sick to your stomach (nauseous) and throw up (vomit). If the vomiting is uncontrollable, call your health care provider. You may tire easily. You may develop headaches that can be relieved by medicines approved by your health care provider. You may urinate more often. Painful urination may mean you have a bladder infection. You may develop heartburn as a result of your pregnancy. You may develop constipation because certain hormones are causing the muscles that push waste through your intestines to slow down. You may develop hemorrhoids or swollen, bulging veins (varicose veins). Your breasts may begin to grow larger and become tender. Your nipples may stick out more, and the tissue that surrounds them (areola) may become darker. Your gums may bleed and may be sensitive to brushing and flossing. Dark spots or blotches (chloasma, mask of pregnancy) may develop on your face. This will likely fade after the baby is  born. Your menstrual periods will stop. You may have a loss of appetite. You may develop cravings for certain kinds of food. You may have changes in your emotions from day to day, such as being excited to be pregnant or being concerned that something may go wrong with the pregnancy and baby. You may have more vivid and strange dreams. You may have changes in your hair. These can include thickening of your hair, rapid growth, and changes in texture. Some women also have hair loss during or after pregnancy, or hair that feels dry or thin. Your hair will most likely return to normal after your baby is born. WHAT TO EXPECT AT YOUR PRENATAL VISITS During a routine prenatal visit: You will be weighed to make sure you and the baby are growing normally. Your blood pressure will be taken. Your abdomen will be measured to track your baby's growth. The fetal heartbeat  will be listened to starting around week 10 or 12 of your pregnancy. Test results from any previous visits will be discussed. Your health care provider may ask you: How you are feeling. If you are feeling the baby move. If you have had any abnormal symptoms, such as leaking fluid, bleeding, severe headaches, or abdominal cramping. If you have any questions. Other tests that may be performed during your first trimester include: Blood tests to find your blood type and to check for the presence of any previous infections. They will also be used to check for low iron levels (anemia) and Rh antibodies. Later in the pregnancy, blood tests for diabetes will be done along with other tests if problems develop. Urine tests to check for infections, diabetes, or protein in the urine. An ultrasound to confirm the proper growth and development of the baby. An amniocentesis to check for possible genetic problems. Fetal screens for spina bifida and Down syndrome. You may need other tests to make sure you and the baby are doing well. HOME CARE  INSTRUCTIONS  Medicines Follow your health care provider's instructions regarding medicine use. Specific medicines may be either safe or unsafe to take during pregnancy. Take your prenatal vitamins as directed. If you develop constipation, try taking a stool softener if your health care provider approves. Diet Eat regular, well-balanced meals. Choose a variety of foods, such as meat or vegetable-based protein, fish, milk and low-fat dairy products, vegetables, fruits, and whole grain breads and cereals. Your health care provider will help you determine the amount of weight gain that is right for you. Avoid raw meat and uncooked cheese. These carry germs that can cause birth defects in the baby. Eating four or five small meals rather than three large meals a day may help relieve nausea and vomiting. If you start to feel nauseous, eating a few soda crackers can be helpful. Drinking liquids between meals instead of during meals also seems to help nausea and vomiting. If you develop constipation, eat more high-fiber foods, such as fresh vegetables or fruit and whole grains. Drink enough fluids to keep your urine clear or pale yellow. Activity and Exercise Exercise only as directed by your health care provider. Exercising will help you: Control your weight. Stay in shape. Be prepared for labor and delivery. Experiencing pain or cramping in the lower abdomen or low back is a good sign that you should stop exercising. Check with your health care provider before continuing normal exercises. Try to avoid standing for long periods of time. Move your legs often if you must stand in one place for a long time. Avoid heavy lifting. Wear low-heeled shoes, and practice good posture. You may continue to have sex unless your health care provider directs you otherwise. Relief of Pain or Discomfort Wear a good support bra for breast tenderness.   Take warm sitz baths to soothe any pain or discomfort caused by  hemorrhoids. Use hemorrhoid cream if your health care provider approves.   Rest with your legs elevated if you have leg cramps or low back pain. If you develop varicose veins in your legs, wear support hose. Elevate your feet for 15 minutes, 3-4 times a day. Limit salt in your diet. Prenatal Care Schedule your prenatal visits by the twelfth week of pregnancy. They are usually scheduled monthly at first, then more often in the last 2 months before delivery. Write down your questions. Take them to your prenatal visits. Keep all your prenatal visits as directed by  your health care provider. Safety Wear your seat belt at all times when driving. Make a list of emergency phone numbers, including numbers for family, friends, the hospital, and police and fire departments. General Tips Ask your health care provider for a referral to a local prenatal education class. Begin classes no later than at the beginning of month 6 of your pregnancy. Ask for help if you have counseling or nutritional needs during pregnancy. Your health care provider can offer advice or refer you to specialists for help with various needs. Do not use hot tubs, steam rooms, or saunas. Do not douche or use tampons or scented sanitary pads. Do not cross your legs for long periods of time. Avoid cat litter boxes and soil used by cats. These carry germs that can cause birth defects in the baby and possibly loss of the fetus by miscarriage or stillbirth. Avoid all smoking, herbs, alcohol, and medicines not prescribed by your health care provider. Chemicals in these affect the formation and growth of the baby. Schedule a dentist appointment. At home, brush your teeth with a soft toothbrush and be gentle when you floss. SEEK MEDICAL CARE IF:  You have dizziness. You have mild pelvic cramps, pelvic pressure, or nagging pain in the abdominal area. You have persistent nausea, vomiting, or diarrhea. You have a bad smelling vaginal  discharge. You have pain with urination. You notice increased swelling in your face, hands, legs, or ankles. SEEK IMMEDIATE MEDICAL CARE IF:  You have a fever. You are leaking fluid from your vagina. You have spotting or bleeding from your vagina. You have severe abdominal cramping or pain. You have rapid weight gain or loss. You vomit blood or material that looks like coffee grounds. You are exposed to [place] measles and have never had them. You are exposed to fifth disease or chickenpox. You develop a severe headache. You have shortness of breath. You have any kind of trauma, such as from a fall or a car accident. Document Released: 02/13/2001 Document Revised: 07/06/2013 Document Reviewed: 12/30/2012 [facility] Patient Information 2015 [place], [place]. This information is not intended to replace advice given to you by your health care provider. Make sure you discuss any questions you have with your health care provider.  Coronavirus (COVID-19) Are you at risk?  Are you at risk for the Coronavirus (COVID-19)?  To be considered HIGH RISK for Coronavirus (COVID-19), you have to meet the following criteria:  Traveled to [place], [place], [place], [place] or [place];  and have fever, cough, and shortness of breath within the last 2 weeks of travel OR Been in close contact with a person diagnosed with COVID-19 within the last 2 weeks and have fever, cough, and shortness of breath IF YOU DO NOT MEET THESE CRITERIA, YOU ARE CONSIDERED LOW RISK FOR COVID-19.  What to do if you are HIGH RISK for COVID-19?  If you are having a medical emergency, call 911. Seek medical care right away. Before you go to a doctor's office, urgent care or emergency department, call ahead and tell them about your recent travel, contact with someone diagnosed with COVID-19, and your symptoms. You should receive instructions from your physician's office regarding next steps of care.  When you arrive at healthcare provider,  tell the healthcare staff immediately you have returned from visiting [place], [place], [place], [place] or [place]; in the last two weeks or you have been in close contact with a person diagnosed with COVID-19 in the last 2 weeks.  Tell the health care staff about your symptoms: fever, cough and shortness of breath. After you have been seen by a medical provider, you will be either: Tested for (COVID-19) and discharged home on quarantine except to seek medical care if symptoms worsen, and asked to  Stay home and avoid contact with others until you get your results (4-5 days)  Avoid travel on public transportation if possible (such as bus, train, or airplane) or Sent to the Emergency Department by EMS for evaluation, COVID-19 testing, and possible admission depending on your condition and test results.  What to do if you are LOW RISK for COVID-19?  Reduce your risk of any infection by using the same precautions used for avoiding the common cold or flu:  Wash your hands often with soap and warm water for at least 20 seconds.  If soap and water are not readily available, use an alcohol-based hand sanitizer with at least 60% alcohol.  If coughing or sneezing, cover your mouth and nose by coughing or sneezing into the elbow areas of your shirt or coat, into a tissue or into your sleeve (not your hands). Avoid shaking hands with others and consider head nods or verbal greetings only. Avoid touching your eyes, nose, or mouth with unwashed hands.  Avoid close contact with people who are sick. Avoid places or events with large numbers of people in one location, like concerts or sporting events. Carefully consider travel plans you have or are making. If you are planning any travel outside or inside the [place], visit the CDC's Travelers' Health webpage for the latest health notices. If you have some symptoms but not all symptoms, continue to monitor at home and seek medical attention if your symptoms worsen. If  you are having a medical emergency, call 911.   ADDITIONAL HEALTHCARE OPTIONS FOR [place] / e-Visit: [email or web address]         [facility]: [facility]: [id number]                   [facility]: [phone]     Safe Medications in Pregnancy   Acne: Benzoyl Peroxide Salicylic Acid  Backache/Headache: Tylenol: 2 regular strength every 4 hours OR              2 Extra strength every 6 hours  Colds/Coughs/Allergies: Benadryl (alcohol free) 25 mg every 6 hours as needed Breath right strips Claritin Cepacol throat lozenges Chloraseptic throat spray Cold-Eeze- up to three times per day Cough drops, alcohol free Flonase (by prescription only) Guaifenesin Mucinex Robitussin DM (plain only, alcohol free) Saline nasal spray/drops Sudafed (pseudoephedrine) & Actifed ** use only after [redacted] weeks gestation and if you do not have high blood pressure Tylenol Vicks Vaporub Zinc lozenges Zyrtec   Constipation: Colace Ducolax suppositories Fleet enema Glycerin suppositories Metamucil Milk of magnesia Miralax Senokot Smooth move tea  Diarrhea: Kaopectate Imodium A-D  *NO pepto Bismol  Hemorrhoids: Anusol Anusol HC Preparation H Tucks  Indigestion: Tums Maalox Mylanta Zantac  Pepcid  Insomnia: Benadryl (alcohol free) 15m every 6 hours as needed Tylenol PM Unisom, no Gelcaps  Leg Cramps: Tums MagGel  Nausea/Vomiting:  Bonine Dramamine Emetrol Ginger extract Sea bands Meclizine  Nausea medication to take during pregnancy:  Unisom (doxylamine succinate 25 mg tablets) Take one tablet daily at bedtime. If symptoms are not adequately controlled, the dose can be increased to a maximum recommended dose of two tablets daily (1/2 tablet in  the morning, 1/2 tablet mid-afternoon and one at bedtime). Vitamin B6 160m tablets. Take one  tablet twice a day (up to 200 mg per day).  Skin Rashes: Aveeno products Benadryl cream or 250mevery 6 hours as needed Calamine Lotion 1% cortisone cream  Yeast infection: Gyne-lotrimin 7 Monistat 7   **If taking multiple medications, please check labels to avoid duplicating the same active ingredients **take medication as directed on the label ** Do not exceed 4000 mg of tylenol in 24 hours **Do not take medications that contain aspirin or ibuprofen

## 2022-04-23 NOTE — Progress Notes (Signed)
INITIAL OBSTETRICAL VISIT Patient name: Frances [name] MRN [id number]  Date of birth: Apr 13, 2000 Chief Complaint:   Initial Prenatal Visit  History of Present Illness:   Frances [name] is a 22 y.o. G31P1102 Caucasian female at 68w2dby [place] 10 weeks with an Estimated Date of Delivery: 11/03/22 being seen today for her initial obstetrical visit.   Her obstetrical history is significant for term SVD and PTD at 36.2 weeks. .   Today she reports still feels nauseated.  Discussed mental health hx:  doesn't feel like therapy is for her. Has taken meds in the past.  Feels like she is doing OK now, Offered referral to establish a relationship w/BH specialist, accepted.     04/23/2022   10:31 AM 04/25/2020    9:40 AM 12/30/2019    2:58 PM  Depression screen PHQ 2/9  Decreased Interest 0 1 2  Down, Depressed, Hopeless 0 0 2  PHQ - 2 Score 0 1 4  Altered sleeping 0 1 2  Tired, decreased energy 0 1 2  Change in appetite 0 1 2  Feeling bad or failure about yourself  0 0 0  Trouble concentrating 0 0 0  Moving slowly or fidgety/restless 0 0 2  Suicidal thoughts 0 0 0  PHQ-9 Score 0 4 12    Patient's last menstrual period was 01/18/2022. Last pap 08/28/19, neg  Review of Systems:   Pertinent items are noted in HPI Denies cramping/contractions, leakage of fluid, vaginal bleeding, abnormal vaginal discharge w/ itching/odor/irritation, headaches, visual changes, shortness of breath, chest pain, abdominal pain, severe nausea/vomiting, or problems with urination or bowel movements unless otherwise stated above.  Pertinent History Reviewed:  Reviewed past medical,surgical, social, obstetrical and family history.  Reviewed problem list, medications and allergies. OB History  Gravida Para Term Preterm AB Living  3 2 1 1   2  $ SAB IAB Ectopic Multiple Live Births        0 2    # Outcome Date GA Lbr Len/2nd Weight Sex Delivery Anes PTL Lv  3 Current           2 Preterm 06/16/20 354w2d8:00 /  00:12 5 lb 14.9 oz (2.69 kg) M Vag-Spont EPI  LIV  1 Term 07/15/19 3819w0d lb 4 oz (2.835 kg) F Vag-Spont EPI N LIV   Physical Assessment:   Vitals:   04/23/22 1006  BP: 108/74  Pulse: 76  Weight: 169 lb (76.7 kg)  Body mass index is 29.94 kg/m.       Physical Examination:  General appearance - well appearing, and in no distress  Mental status - alert, oriented to person, place, and time  Psych:  She has a normal mood and affect  Skin - warm and dry, normal color, no suspicious lesions noted  Chest - effort normal  Heart - normal rate and regular rhythm  Abdomen - soft, nontender  Extremities:  No swelling or varicosities noted   No results found for this or any previous visit (from the past 24 hour(s)).   Indications for ASA therapy (per uptodate)     04/23/2022   10:31 AM 04/25/2020    9:40 AM 12/30/2019    2:58 PM  Depression screen PHQ 2/9  Decreased Interest 0 1 2  Down, Depressed, Hopeless 0 0 2  PHQ - 2 Score 0 1 4  Altered sleeping 0 1 2  Tired, decreased energy 0 1 2  Change in appetite 0 1 2  Feeling bad or failure about yourself  0 0 0  Trouble concentrating 0 0 0  Moving slowly or fidgety/restless 0 0 2  Suicidal thoughts 0 0 0  PHQ-9 Score 0 4 12        04/23/2022   10:31 AM 04/25/2020    9:41 AM 12/30/2019    2:58 PM  GAD 7 : Generalized Anxiety Score  Nervous, Anxious, on Edge 0 1 2  Control/stop worrying 0 0 2  Worry too much - different things 0 0 2  Trouble relaxing 0 0 2  Restless 0 0 2  Easily annoyed or irritable 0 1 2  Afraid - awful might happen 0 0 2  Total GAD 7 Score 0 2 14      Assessment & Plan:  1) Low-Risk Pregnancy G3P1102 at 35w2dwith an Estimated Date of Delivery: 11/03/22   2) Initial OB visit  3) Depression w/hx suicide attempt:  referral sent to [facility]  Meds:  Meds ordered this encounter  Medications   DISCONTD: Blood Pressure Monitor MISC    Sig: For regular home bp monitoring during pregnancy    Dispense:  1  each    Refill:  0    Z34.81 Please mail to patient   Doxylamine-Pyridoxine (DICLEGIS) 10-10 MG TBEC    Sig: Take 2 qhs; may also take one in am and one in afternoon prn nausea    Dispense:  120 tablet    Refill:  6    Order Specific Question:   Supervising Provider    Answer:   EElonda Husky LUTHER H [[id number]]   ondansetron (ZOFRAN-ODT) 4 MG disintegrating tablet    Sig: Take 1 tablet (4 mg total) by mouth every 6 (six) hours as needed for nausea.    Dispense:  30 tablet    Refill:  2    Order Specific Question:   Supervising Provider    Answer:   ETania AdeH [[id number]]    Initial labs obtained Continue prenatal vitamins Reviewed n/v relief measures and warning s/s to report Reviewed recommended weight gain based on pre-gravid BMI Encouraged well-balanced diet Genetic & carrier screening discussed: requests Panorama and AFP, declines NT/IT and Horizon  Ultrasound discussed; fetal survey: requested CCNC completed> form faxed if has or is planning to apply for medicaid The nature of [place] [facility] MDs and other Advanced Practice Providers was explained to patient; also emphasized that fellows, residents, and students are part of our team. Has home bp cuf. Check bp weekly, let u[place]now if >140/90.        FJoaquim LaiCresenzo-Dishmon 10:40 AM

## 2022-04-24 LAB — CBC/D/PLT+RPR+RH+ABO+RUBIGG...
Antibody Screen: NEGATIVE
Basophils Absolute: 0.1 10*3/uL (ref 0.0–0.2)
Basos: 1 %
EOS (ABSOLUTE): 0.6 10*3/uL — ABNORMAL HIGH (ref 0.0–0.4)
Eos: 6 %
HCV Ab: NONREACTIVE
HIV Screen 4th Generation wRfx: NONREACTIVE
Hematocrit: 38.8 % (ref 34.0–46.6)
Hemoglobin: 13.4 g/dL (ref 11.1–15.9)
Hepatitis B Surface Ag: NEGATIVE
Immature Grans (Abs): 0.1 10*3/uL (ref 0.0–0.1)
Immature Granulocytes: 1 %
Lymphocytes Absolute: 2.1 10*3/uL (ref 0.7–3.1)
Lymphs: 21 %
MCH: 29 pg (ref 26.6–33.0)
MCHC: 34.5 g/dL (ref 31.5–35.7)
MCV: 84 fL (ref 79–97)
Monocytes Absolute: 0.5 10*3/uL (ref 0.1–0.9)
Monocytes: 5 %
Neutrophils Absolute: 6.6 10*3/uL (ref 1.4–7.0)
Neutrophils: 66 %
Platelets: 306 10*3/uL (ref 150–450)
RBC: 4.62 x10E6/uL (ref 3.77–5.28)
RDW: 13.2 % (ref 11.7–15.4)
RPR Ser Ql: NONREACTIVE
Rh Factor: POSITIVE
Rubella Antibodies, IGG: 5.6 index (ref >0.99)
WBC: 9.8 10*3/uL (ref 3.4–10.8)

## 2022-04-24 LAB — HCV INTERPRETATION

## 2022-04-25 LAB — URINE CULTURE

## 2022-04-26 LAB — GC/CHLAMYDIA PROBE AMP
Chlamydia trachomatis, NAA: NEGATIVE
Neisseria Gonorrhoeae by PCR: NEGATIVE

## 2022-04-30 NOTE — BH Specialist Note (Unsigned)
Integrated Behavioral Health via Telemedicine Visit  05/10/2022 Brianny Mansouri [id number]  Number of [place] Clinician visits: 1- Initial Visit  Session Start time: [id number]   Session End time: 1330  Total time in minutes: 13   Referring Provider: Christin Fudge, CNM Patient/Family location: Home [facility] Provider location: Center for [place] at [facility]  All persons participating in visit: Patient Frances [name] and [place]   Types of Service: Individual psychotherapy and Video visit  I connected with Otho Najjar and/or Tonsina Saylor's  children  via  Telephone or Geologist, engineering  (Video is Caregility application) and verified that I am speaking with the correct person using two identifiers. Discussed confidentiality: Yes   I discussed the limitations of telemedicine and the availability of in person appointments.  Discussed there is a possibility of technology failure and discussed alternative modes of communication if that failure occurs.  I discussed that engaging in this telemedicine visit, they consent to the provision of behavioral healthcare and the services will be billed under their insurance.  Patient and/or legal guardian expressed understanding and consented to Telemedicine visit: Yes   Presenting Concerns: Patient and/or family reports the following symptoms/concerns: Pt states history of depression postpartum, with no current depression. Pt is eating and sleeping well, has good support at home; no concern at this time.   Patient and/or Family's Strengths/Protective Factors: Social connections, Concrete supports in place (healthy food, safe environments, etc.), Sense of purpose, and Physical Health (exercise, healthy diet, medication compliance, etc.)  Goals Addressed: Patient will:  Maintain reduction of symptoms of: anxiety and depression   Increase knowledge  and/or ability of: healthy habits    Progress towards Goals: Achieved  Interventions: Interventions utilized:  Functional Assessment of ADLs and Psychoeducation and/or Health Education Standardized Assessments completed: Not Needed  Patient and/or Family Response: Patient agrees with treatment plan.   Assessment: Patient currently experiencing History of depression postpartum.   Patient may benefit from psychoeducation and brief therapeutic interventions regarding maintaining reduction of depression and anxiety .  Plan: Follow up with behavioral health clinician on : Call Graylee Arutyunyan at [phone], as needed. Behavioral recommendations:  -Continue taking prenatal vitamin daily -Continue prioritizing healthy self-care (regular meals, adequate rest; allowing practical help from supportive friends and family)   Referral(s): [place] (In Clinic)  I discussed the assessment and treatment plan with the patient and/or parent/guardian. They were provided an opportunity to ask questions and all were answered. They agreed with the plan and demonstrated an understanding of the instructions.   They were advised to call back or seek an in-person evaluation if the symptoms worsen or if the condition fails to improve as anticipated.  Garlan Fair, LCSW     04/23/2022   10:31 AM 04/25/2020    9:40 AM 12/30/2019    2:58 PM  Depression screen PHQ 2/9  Decreased Interest 0 1 2  Down, Depressed, Hopeless 0 0 2  PHQ - 2 Score 0 1 4  Altered sleeping 0 1 2  Tired, decreased energy 0 1 2  Change in appetite 0 1 2  Feeling bad or failure about yourself  0 0 0  Trouble concentrating 0 0 0  Moving slowly or fidgety/restless 0 0 2  Suicidal thoughts 0 0 0  PHQ-9 Score 0 4 12      04/23/2022   10:31 AM 04/25/2020    9:41 AM 12/30/2019    2:58 PM  GAD  7 : Generalized Anxiety Score  Nervous, Anxious, on Edge 0 1 2  Control/stop worrying 0 0 2  Worry too much -  different things 0 0 2  Trouble relaxing 0 0 2  Restless 0 0 2  Easily annoyed or irritable 0 1 2  Afraid - awful might happen 0 0 2  Total GAD 7 Score 0 2 14

## 2022-05-03 LAB — PANORAMA PRENATAL TEST FULL PANEL:PANORAMA TEST PLUS 5 ADDITIONAL MICRODELETIONS: FETAL FRACTION: 5

## 2022-05-10 ENCOUNTER — Ambulatory Visit: Payer: Medicaid Other | Admitting: Clinical

## 2022-05-10 DIAGNOSIS — Z8759 Personal history of other complications of pregnancy, childbirth and the puerperium: Secondary | ICD-10-CM

## 2022-05-10 NOTE — Patient Instructions (Signed)
Center for [facility] at [facility] [place],  [id number] [phone] (main office) [phone] [facility] office)  [email or web address]

## 2022-05-21 ENCOUNTER — Encounter: Payer: Self-pay | Admitting: Women's Health

## 2022-05-21 ENCOUNTER — Ambulatory Visit (INDEPENDENT_AMBULATORY_CARE_PROVIDER_SITE_OTHER): Payer: Medicaid Other | Admitting: Women's Health

## 2022-05-21 VITALS — BP 117/81 | HR 79 | Wt 167.0 lb

## 2022-05-21 DIAGNOSIS — Z8751 Personal history of pre-term labor: Secondary | ICD-10-CM

## 2022-05-21 DIAGNOSIS — Z3480 Encounter for supervision of other normal pregnancy, unspecified trimester: Secondary | ICD-10-CM | POA: Diagnosis not present

## 2022-05-21 DIAGNOSIS — Z1379 Encounter for other screening for genetic and chromosomal anomalies: Secondary | ICD-10-CM

## 2022-05-21 DIAGNOSIS — B009 Herpesviral infection, unspecified: Secondary | ICD-10-CM

## 2022-05-21 DIAGNOSIS — Z363 Encounter for antenatal screening for malformations: Secondary | ICD-10-CM

## 2022-05-21 DIAGNOSIS — Z3A16 16 weeks gestation of pregnancy: Secondary | ICD-10-CM

## 2022-05-21 DIAGNOSIS — F332 Major depressive disorder, recurrent severe without psychotic features: Secondary | ICD-10-CM

## 2022-05-21 DIAGNOSIS — Z3482 Encounter for supervision of other normal pregnancy, second trimester: Secondary | ICD-10-CM

## 2022-05-21 NOTE — Progress Notes (Signed)
LOW-RISK PREGNANCY VISIT Patient name: Frances [name] MRN [id number]  Date of birth: 09-Feb-2001 Chief Complaint:   Routine Prenatal Visit  History of Present Illness:   Frances [name] is a 22 y.o. [id number] female at [redacted]w[redacted]d with an Estimated Date of Delivery: 11/03/22 being seen today for ongoing management of a low-risk pregnancy.   Today she reports no complaints. Contractions: Not present. Vag. Bleeding: None.  Movement: Present. denies leaking of fluid.     04/23/2022   10:31 AM 04/25/2020    9:40 AM 12/30/2019    2:58 PM  Depression screen PHQ 2/9  Decreased Interest 0 1 2  Down, Depressed, Hopeless 0 0 2  PHQ - 2 Score 0 1 4  Altered sleeping 0 1 2  Tired, decreased energy 0 1 2  Change in appetite 0 1 2  Feeling bad or failure about yourself  0 0 0  Trouble concentrating 0 0 0  Moving slowly or fidgety/restless 0 0 2  Suicidal thoughts 0 0 0  PHQ-9 Score 0 4 12        04/23/2022   10:31 AM 04/25/2020    9:41 AM 12/30/2019    2:58 PM  GAD 7 : Generalized Anxiety Score  Nervous, Anxious, on Edge 0 1 2  Control/stop worrying 0 0 2  Worry too much - different things 0 0 2  Trouble relaxing 0 0 2  Restless 0 0 2  Easily annoyed or irritable 0 1 2  Afraid - awful might happen 0 0 2  Total GAD 7 Score 0 2 14      Review of Systems:   Pertinent items are noted in HPI Denies abnormal vaginal discharge w/ itching/odor/irritation, headaches, visual changes, shortness of breath, chest pain, abdominal pain, severe nausea/vomiting, or problems with urination or bowel movements unless otherwise stated above. Pertinent History Reviewed:  Reviewed past medical,surgical, social, obstetrical and family history.  Reviewed problem list, medications and allergies. Physical Assessment:   Vitals:   05/21/22 0903  BP: 117/81  Pulse: 79  Weight: 167 lb (75.8 kg)  Body mass index is 29.58 kg/m.        Physical Examination:   General appearance: Well appearing, and in no  distress  Mental status: Alert, oriented to person, place, and time  Skin: Warm & dry  Cardiovascular: Normal heart rate noted  Respiratory: Normal respiratory effort, no distress  Abdomen: Soft, gravid, nontender  Pelvic: Cervical exam deferred         Extremities: Edema: None  Fetal Status: Fetal Heart Rate (bpm): 145   Movement: Present    Chaperone: N/A   No results found for this or any previous visit (from the past 24 hour(s)).  Assessment & Plan:  1) Low-risk pregnancy G3P1102 at [redacted]w[redacted]d with an Estimated Date of Delivery: 11/03/22   2) H/O 36wk PTB, d/t SOL  3) Dep/anx> no meds, had IBH visit w/ Roselyn Reef   Meds: No orders of the defined types were placed in this encounter.  Labs/procedures today: AFP  Plan:  Continue routine obstetrical care  Next visit: prefers will be in person for u/s     Reviewed: Preterm labor symptoms and general obstetric precautions including but not limited to vaginal bleeding, contractions, leaking of fluid and fetal movement were reviewed in detail with the patient.  All questions were answered. Does have home bp cuff. Office bp cuff given: not applicable. Check bp weekly, let us know if consistently >140 and/or >90.  Follow-up: Return  for As scheduled.  Future Appointments  Date Time Provider [place]  06/11/2022  9:15 AM [facility] [place] [facility] None  06/11/2022 10:10 AM Roma Schanz, CNM [facility]    Orders Placed This Encounter  Procedures   AFP, Serum, Open Spina Bifida   Roma Schanz CNM, [facility] 05/21/2022 9:36 AM

## 2022-05-21 NOTE — Patient Instructions (Signed)
[place], thank you for choosing our office today! We appreciate the opportunity to meet your healthcare needs. You may receive a short survey by mail, e-mail, or through [employer]. If you are happy with your care we would appreciate if you could take just a few minutes to complete the survey questions. We read all of your comments and take your feedback very seriously. Thank you again for choosing our office.  Center for [employer] Team at [place] at [facility] ([place], [place] [address]) Entrance C, located off of [address] parking  Go to [email or web address] to register for FREE online childbirth classes  Call the office [phone]) or go to [facility] if: You begin to severe cramping Your water breaks.  Sometimes it is a big gush of fluid, sometimes it is just a trickle that keeps getting your panties wet or running down your legs You have vaginal bleeding.  It is normal to have a small amount of spotting if your cervix was checked.   [facility] Pediatricians/Family Doctors [facility]): [address] [place], [place] [facility]: [address] [place], [phone]                [facility]): [place], [phone] (call to ask if accepting patients) [facility] Department: [address], [place], [place] Pediatricians/Family Doctors [facility]): [place]. [place], [address], [place] [facility]: [address] [place], [place] [facility] [place]: [place], [place] [facility]): [phone] [facility]: [address], [place]: [address] [place] [facility]: [phone], [phone]  Home Blood Pressure Monitoring for Patients   Your provider has recommended that you check your blood pressure (BP) at least once a week at home. If you do not have a blood pressure cuff at home, one will be provided for you. Contact your provider if you have not received your monitor within 1 week.   Helpful Tips for Accurate Home Blood Pressure Checks  Don't smoke, exercise, or drink caffeine 30 minutes before checking your BP Use the restroom before checking your BP (a full bladder can raise your pressure) Relax in a comfortable upright chair Feet on the ground Left arm resting comfortably on a flat surface at the level of your heart Legs uncrossed Back supported Sit quietly and don't talk Place the cuff on your bare arm Adjust snuggly, so that only two fingertips can fit between your skin and the top of the cuff Check 2 readings separated by at least one minute Keep a log of your BP readings For a visual, please reference this diagram: http://ccnc.care/bpdiagram  Provider Name: [facility]     Phone: [phone]  Zone 1: ALL CLEAR  Continue to monitor your symptoms:  BP reading is less than 140 (top number) or less than 90 (bottom number)  No right upper stomach pain No headaches or seeing spots No feeling nauseated or throwing up No swelling in face and hands  Zone 2: CAUTION Call your doctor's office for any of the following:  BP reading is greater than 140 (top number) or greater than  90 (bottom number)  Stomach pain under your ribs in the middle or right side Headaches or seeing spots Feeling nauseated or throwing up Swelling in face and hands  Zone 3: EMERGENCY  Seek immediate medical care if you have any of the following:  BP reading is greater than160 (top number) or greater than 110 (bottom number) Severe headaches not improving with Tylenol Serious difficulty catching your breath Any worsening symptoms from  Zone 2     Second Trimester of Pregnancy The second trimester is from week 14 through week 27 (months 4 through 6). The second trimester is often a time when you feel your best. Your body has adjusted to being pregnant, and you begin to feel better physically. Usually, morning sickness has lessened or quit completely, you may have more energy, and you may have an increase in appetite. The second trimester is also a time when the fetus is growing rapidly. At the end of the sixth month, the fetus is about 9 inches long and weighs about 1 pounds. You will likely begin to feel the baby move (quickening) between 16 and 20 weeks of pregnancy. Body changes during your second trimester Your body continues to go through many changes during your second trimester. The changes vary from woman to woman. Your weight will continue to increase. You will notice your lower abdomen bulging out. You may begin to get stretch marks on your hips, abdomen, and breasts. You may develop headaches that can be relieved by medicines. The medicines should be approved by your health care provider. You may urinate more often because the fetus is pressing on your bladder. You may develop or continue to have heartburn as a result of your pregnancy. You may develop constipation because certain hormones are causing the muscles that push waste through your intestines to slow down. You may develop hemorrhoids or swollen, bulging veins (varicose veins). You may have back pain. This is caused by: Weight gain. Pregnancy hormones that are relaxing the joints in your pelvis. A shift in weight and the muscles that support your balance. Your breasts will continue to grow and they will continue to become tender. Your gums may bleed and may be sensitive to brushing and flossing. Dark spots or blotches (chloasma, mask of pregnancy) may develop on your face. This will likely fade after the baby is born. A dark line from your belly button to  the pubic area (linea nigra) may appear. This will likely fade after the baby is born. You may have changes in your hair. These can include thickening of your hair, rapid growth, and changes in texture. Some women also have hair loss during or after pregnancy, or hair that feels dry or thin. Your hair will most likely return to normal after your baby is born.  What to expect at prenatal visits During a routine prenatal visit: You will be weighed to make sure you and the fetus are growing normally. Your blood pressure will be taken. Your abdomen will be measured to track your baby's growth. The fetal heartbeat will be listened to. Any test results from the previous visit will be discussed.  Your health care provider may ask you: How you are feeling. If you are feeling the baby move. If you have had any abnormal symptoms, such as leaking fluid, bleeding, severe headaches, or abdominal cramping. If you are using any tobacco products, including cigarettes, chewing tobacco, and electronic cigarettes. If you have any questions.  Other tests that may be performed during   your second trimester include: Blood tests that check for: Low iron levels (anemia). High blood sugar that affects pregnant women (gestational diabetes) between 24 and 28 weeks. Rh antibodies. This is to check for a protein on red blood cells (Rh factor). Urine tests to check for infections, diabetes, or protein in the urine. An ultrasound to confirm the proper growth and development of the baby. An amniocentesis to check for possible genetic problems. Fetal screens for spina bifida and Down syndrome. HIV (human immunodeficiency virus) testing. Routine prenatal testing includes screening for HIV, unless you choose not to have this test.  Follow these instructions at home: Medicines Follow your health care provider's instructions regarding medicine use. Specific medicines may be either safe or unsafe to take during  pregnancy. Take a prenatal vitamin that contains at least 600 micrograms (mcg) of folic acid. If you develop constipation, try taking a stool softener if your health care provider approves. Eating and drinking Eat a balanced diet that includes fresh fruits and vegetables, whole grains, good sources of protein such as meat, eggs, or tofu, and low-fat dairy. Your health care provider will help you determine the amount of weight gain that is right for you. Avoid raw meat and uncooked cheese. These carry germs that can cause birth defects in the baby. If you have low calcium intake from food, talk to your health care provider about whether you should take a daily calcium supplement. Limit foods that are high in fat and processed sugars, such as fried and sweet foods. To prevent constipation: Drink enough fluid to keep your urine clear or pale yellow. Eat foods that are high in fiber, such as fresh fruits and vegetables, whole grains, and beans. Activity Exercise only as directed by your health care provider. Most women can continue their usual exercise routine during pregnancy. Try to exercise for 30 minutes at least 5 days a week. Stop exercising if you experience uterine contractions. Avoid heavy lifting, wear low heel shoes, and practice good posture. A sexual relationship may be continued unless your health care provider directs you otherwise. Relieving pain and discomfort Wear a good support bra to prevent discomfort from breast tenderness. Take warm sitz baths to soothe any pain or discomfort caused by hemorrhoids. Use hemorrhoid cream if your health care provider approves. Rest with your legs elevated if you have leg cramps or low back pain. If you develop varicose veins, wear support hose. Elevate your feet for 15 minutes, 3-4 times a day. Limit salt in your diet. Prenatal Care Write down your questions. Take them to your prenatal visits. Keep all your prenatal visits as told by your health  care provider. This is important. Safety Wear your seat belt at all times when driving. Make a list of emergency phone numbers, including numbers for family, friends, the hospital, and police and fire departments. General instructions Ask your health care provider for a referral to a local prenatal education class. Begin classes no later than the beginning of month 6 of your pregnancy. Ask for help if you have counseling or nutritional needs during pregnancy. Your health care provider can offer advice or refer you to specialists for help with various needs. Do not use hot tubs, steam rooms, or saunas. Do not douche or use tampons or scented sanitary pads. Do not cross your legs for long periods of time. Avoid cat litter boxes and soil used by cats. These carry germs that can cause birth defects in the baby and possibly loss of the   fetus by miscarriage or stillbirth. Avoid all smoking, herbs, alcohol, and unprescribed drugs. Chemicals in these products can affect the formation and growth of the baby. Do not use any products that contain nicotine or tobacco, such as cigarettes and e-cigarettes. If you need help quitting, ask your health care provider. Visit your dentist if you have not gone yet during your pregnancy. Use a soft toothbrush to brush your teeth and be gentle when you floss. Contact a health care provider if: You have dizziness. You have mild pelvic cramps, pelvic pressure, or nagging pain in the abdominal area. You have persistent nausea, vomiting, or diarrhea. You have a bad smelling vaginal discharge. You have pain when you urinate. Get help right away if: You have a fever. You are leaking fluid from your vagina. You have spotting or bleeding from your vagina. You have severe abdominal cramping or pain. You have rapid weight gain or weight loss. You have shortness of breath with chest pain. You notice sudden or extreme swelling of your face, hands, ankles, feet, or legs. You  have not felt your baby move in over an hour. You have severe headaches that do not go away when you take medicine. You have vision changes. Summary The second trimester is from week 14 through week 27 (months 4 through 6). It is also a time when the fetus is growing rapidly. Your body goes through many changes during pregnancy. The changes vary from woman to woman. Avoid all smoking, herbs, alcohol, and unprescribed drugs. These chemicals affect the formation and growth your baby. Do not use any tobacco products, such as cigarettes, chewing tobacco, and e-cigarettes. If you need help quitting, ask your health care provider. Contact your health care provider if you have any questions. Keep all prenatal visits as told by your health care provider. This is important. This information is not intended to replace advice given to you by your health care provider. Make sure you discuss any questions you have with your health care provider. Document Released: 02/13/2001 Document Revised: 07/28/2015 Document Reviewed: 04/22/2012 Elsevier Interactive Patient Education  2017 [employer].

## 2022-05-23 LAB — AFP, SERUM, OPEN SPINA BIFIDA
AFP MoM: 0.95
AFP Value: 30.7 ng/mL
Gest. Age on Collection Date: 16.2 weeks
Maternal Age At EDD: 22.9 yr
OSBR Risk 1 IN: 10000
Test Results:: NEGATIVE
Weight: 167 [lb_av]

## 2022-06-11 ENCOUNTER — Ambulatory Visit (INDEPENDENT_AMBULATORY_CARE_PROVIDER_SITE_OTHER): Payer: Medicaid Other

## 2022-06-11 ENCOUNTER — Ambulatory Visit (INDEPENDENT_AMBULATORY_CARE_PROVIDER_SITE_OTHER): Payer: Medicaid Other | Admitting: Women's Health

## 2022-06-11 ENCOUNTER — Encounter: Payer: Self-pay | Admitting: Women's Health

## 2022-06-11 VITALS — BP 108/70 | HR 77 | Wt 167.0 lb

## 2022-06-11 DIAGNOSIS — Z363 Encounter for antenatal screening for malformations: Secondary | ICD-10-CM

## 2022-06-11 DIAGNOSIS — Z3480 Encounter for supervision of other normal pregnancy, unspecified trimester: Secondary | ICD-10-CM

## 2022-06-11 DIAGNOSIS — Z3A19 19 weeks gestation of pregnancy: Secondary | ICD-10-CM

## 2022-06-11 DIAGNOSIS — Z3482 Encounter for supervision of other normal pregnancy, second trimester: Secondary | ICD-10-CM

## 2022-06-11 NOTE — Progress Notes (Signed)
LOW-RISK PREGNANCY VISIT Patient name: Frances [name] MRN [id number]  Date of birth: [birth year] Chief Complaint:   Routine Prenatal Visit and Pregnancy Ultrasound  History of Present Illness:   Frances [name] is a 22 y.o. G20P1102 female at [redacted]w[redacted]d with an Estimated Date of Delivery: 11/03/22 being seen today for ongoing management of a low-risk pregnancy.   Today she reports no complaints. Contractions: Not present.  .  Movement: Absent. denies leaking of fluid.     04/23/2022   10:31 AM 04/25/2020    9:40 AM 12/30/2019    2:58 PM  Depression screen PHQ 2/9  Decreased Interest 0 1 2  Down, Depressed, Hopeless 0 0 2  PHQ - 2 Score 0 1 4  Altered sleeping 0 1 2  Tired, decreased energy 0 1 2  Change in appetite 0 1 2  Feeling bad or failure about yourself  0 0 0  Trouble concentrating 0 0 0  Moving slowly or fidgety/restless 0 0 2  Suicidal thoughts 0 0 0  PHQ-9 Score 0 4 12        04/23/2022   10:31 AM 04/25/2020    9:41 AM 12/30/2019    2:58 PM  GAD 7 : Generalized Anxiety Score  Nervous, Anxious, on Edge 0 1 2  Control/stop worrying 0 0 2  Worry too much - different things 0 0 2  Trouble relaxing 0 0 2  Restless 0 0 2  Easily annoyed or irritable 0 1 2  Afraid - awful might happen 0 0 2  Total GAD 7 Score 0 2 14      Review of Systems:   Pertinent items are noted in HPI Denies abnormal vaginal discharge w/ itching/odor/irritation, headaches, visual changes, shortness of breath, chest pain, abdominal pain, severe nausea/vomiting, or problems with urination or bowel movements unless otherwise stated above. Pertinent History Reviewed:  Reviewed past medical,surgical, social, obstetrical and family history.  Reviewed problem list, medications and allergies. Physical Assessment:   Vitals:   06/11/22 0958  BP: 108/70  Pulse: 77  Weight: 167 lb (75.8 kg)  Body mass index is 29.58 kg/m.        Physical Examination:   General appearance: Well appearing, and in  no distress  Mental status: Alert, oriented to person, place, and time  Skin: Warm & dry  Cardiovascular: Normal heart rate noted  Respiratory: Normal respiratory effort, no distress  Abdomen: Soft, gravid, nontender  Pelvic: Cervical exam deferred         Extremities: Edema: None  Fetal Status: Fetal Heart Rate (bpm): +u/s   Movement: Absent  [place] 19+2 wks,cephalic,posterior placenta gr 0,normal ovaries,cx 3.2 cm,FHR 150 BPM,SVP of fluid 3.5 cm,EFW 305 g 66%,anatomy complete,no obvious abnormalities   Chaperone: N/A   No results found for this or any previous visit (from the past 24 hour(s)).  Assessment & Plan:  1) Low-risk pregnancy G3P1102 at [redacted]w[redacted]d with an Estimated Date of Delivery: 11/03/22   2) H/O 36wk PTB d/t SOL   Meds: No orders of the defined types were placed in this encounter.  Labs/procedures today: U/S  Plan:  Continue routine obstetrical care  Next visit: prefers online    Reviewed: Preterm labor symptoms and general obstetric precautions including but not limited to vaginal bleeding, contractions, leaking of fluid and fetal movement were reviewed in detail with the patient.  All questions were answered. Does have home bp cuff. Office bp cuff given: not applicable. Check bp weekly, let us know if  consistently >140 and/or >90.  Follow-up: Return in about 4 weeks (around 07/09/2022) for LROB, CNM, MyChart Video.  No future appointments.  No orders of the defined types were placed in this encounter.  Cheral Marker CNM, [facility] 06/11/2022 10:31 AM

## 2022-06-11 NOTE — Progress Notes (Signed)
[place] 19+2 wks,cephalic,posterior placenta gr 0,normal ovaries,cx 3.2 cm,FHR 150 BPM,SVP of fluid 3.5 cm,EFW 305 g 66%,anatomy complete,no obvious abnormalities

## 2022-06-11 NOTE — Patient Instructions (Signed)
Frances [name], thank you for choosing our office today! We appreciate the opportunity to meet your healthcare needs. You may receive a short survey by mail, e-mail, or through MyChart. If you are happy with your care we would appreciate if you could take just a few minutes to complete the survey questions. We read all of your comments and take your feedback very seriously. Thank you again for choosing our office.  [facility] Team at [facility] ([address] [place], [place] [address]) Entrance C, located off of [address] Free 24/7 valet parking  Go to [email or web address] to register for FREE online childbirth classes  Call the office ([phone]) or go to [facility] if: You begin to severe cramping Your water breaks.  Sometimes it is a big gush of fluid, sometimes it is just a trickle that keeps getting your panties wet or running down your legs You have vaginal bleeding.  It is normal to have a small amount of spotting if your cervix was checked.   [place] [facility] Doctors [facility] ([facility]): [address], [phone]           [facility]: [address], [phone]                [facility] ([facility]): [address], [phone] (call to ask if accepting patients) [facility]: [address], [place], [phone]    [place] [facility]/Family Doctors [facility] ([facility]): [address], [phone] [facility]: [address], [phone] [facility]: [address], [phone]  [place] [facility]  [facility] ([facility]): [phone] [facility]: [address], [phone]   [place] [facility]: [address], [phone]  [place] [facility]  [facility]: [address], [phone]  Home Blood Pressure Monitoring for Patients   Your provider has recommended that you check your blood pressure (BP) at least once a week at home. If you do not have a blood pressure cuff at home, one will be provided for you. Contact your provider if you have not received your monitor within 1 week.   Helpful Tips for Accurate Home Blood Pressure Checks  Don't smoke, exercise, or drink caffeine 30 minutes before checking your BP Use the restroom before checking your BP (a full bladder can raise your pressure) Relax in a comfortable upright chair Feet on the ground Left arm resting comfortably on a flat surface at the level of your heart Legs uncrossed Back supported Sit quietly and don't talk Place the cuff on your bare arm Adjust snuggly, so that only two fingertips can fit between your skin and the top of the cuff Check 2 readings separated by at least one minute Keep a log of your BP readings For a visual, please reference this diagram: http://[facility]nc.care/bpdiagram  Provider Name: [facility]     Phone: [phone]  Zone 1: ALL CLEAR  Continue to monitor your symptoms:  BP reading is less than 140 (top number) or less than 90 (bottom number)  No right upper stomach pain No headaches or seeing spots No feeling nauseated or throwing up No swelling in face and hands  Zone 2: CAUTION Call your doctor's office for any of the following:  BP reading is greater than 140 (top number) or greater than   90 (bottom number)  Stomach pain under your ribs in the middle or right side Headaches or seeing spots Feeling nauseated or throwing up Swelling in face and hands  Zone 3: EMERGENCY  Seek immediate medical care if you have any of the following:  BP reading is greater than160 (top number) or greater than 110 (bottom number) Severe headaches not improving with Tylenol Serious difficulty catching your breath Any worsening symptoms from  Zone 2     Second Trimester of Pregnancy The second trimester is from week 14 through week 27 (months 4 through 6). The second trimester is often a time when you feel your best. Your body has adjusted to being pregnant, and you begin to feel better physically. Usually, morning sickness has lessened or quit completely, you may have more energy, and you may have an increase in appetite. The second trimester is also a time when the fetus is growing rapidly. At the end of the sixth month, the fetus is about 9 inches long and weighs about 1 pounds. You will likely begin to feel the baby move (quickening) between 16 and 20 weeks of pregnancy. Body changes during your second trimester Your body continues to go through many changes during your second trimester. The changes vary from woman to woman. Your weight will continue to increase. You will notice your lower abdomen bulging out. You may begin to get stretch marks on your hips, abdomen, and breasts. You may develop headaches that can be relieved by medicines. The medicines should be approved by your health care provider. You may urinate more often because the fetus is pressing on your bladder. You may develop or continue to have heartburn as a result of your pregnancy. You may develop constipation because certain hormones are causing the muscles that push waste through your intestines to slow down. You may develop hemorrhoids or swollen, bulging veins (varicose veins). You may have back pain. This is caused by: Weight gain. Pregnancy hormones that are relaxing the joints in your pelvis. A shift in weight and the muscles that support your balance. Your breasts will continue to grow and they will continue to become tender. Your gums may bleed and may be sensitive to brushing and flossing. Dark spots or blotches (chloasma, mask of pregnancy) may develop on your face. This will likely fade after the baby is born. A dark line from your belly button to  the pubic area (linea nigra) may appear. This will likely fade after the baby is born. You may have changes in your hair. These can include thickening of your hair, rapid growth, and changes in texture. Some women also have hair loss during or after pregnancy, or hair that feels dry or thin. Your hair will most likely return to normal after your baby is born.  What to expect at prenatal visits During a routine prenatal visit: You will be weighed to make sure you and the fetus are growing normally. Your blood pressure will be taken. Your abdomen will be measured to track your baby's growth. The fetal heartbeat will be listened to. Any test results from the previous visit will be discussed.  Your health care provider may ask you: How you are feeling. If you are feeling the baby move. If you have had any abnormal symptoms, such as leaking fluid, bleeding, severe headaches, or abdominal cramping. If you are using any tobacco products, including cigarettes, chewing tobacco, and electronic cigarettes. If you have any questions.  Other tests that may be performed during   your second trimester include: Blood tests that check for: Low iron levels (anemia). High blood sugar that affects pregnant women (gestational diabetes) between 24 and 28 weeks. Rh antibodies. This is to check for a protein on red blood cells (Rh factor). Urine tests to check for infections, diabetes, or protein in the urine. An ultrasound to confirm the proper growth and development of the baby. An amniocentesis to check for possible genetic problems. Fetal screens for spina bifida and Down syndrome. HIV (human immunodeficiency virus) testing. Routine prenatal testing includes screening for HIV, unless you choose not to have this test.  Follow these instructions at home: Medicines Follow your health care provider's instructions regarding medicine use. Specific medicines may be either safe or unsafe to take during  pregnancy. Take a prenatal vitamin that contains at least 600 micrograms (mcg) of folic acid. If you develop constipation, try taking a stool softener if your health care provider approves. Eating and drinking Eat a balanced diet that includes fresh fruits and vegetables, whole grains, good sources of protein such as meat, eggs, or tofu, and low-fat dairy. Your health care provider will help you determine the amount of weight gain that is right for you. Avoid raw meat and uncooked cheese. These carry germs that can cause birth defects in the baby. If you have low calcium intake from food, talk to your health care provider about whether you should take a daily calcium supplement. Limit foods that are high in fat and processed sugars, such as fried and sweet foods. To prevent constipation: Drink enough fluid to keep your urine clear or pale yellow. Eat foods that are high in fiber, such as fresh fruits and vegetables, whole grains, and beans. Activity Exercise only as directed by your health care provider. Most women can continue their usual exercise routine during pregnancy. Try to exercise for 30 minutes at least 5 days a week. Stop exercising if you experience uterine contractions. Avoid heavy lifting, wear low heel shoes, and practice good posture. A sexual relationship may be continued unless your health care provider directs you otherwise. Relieving pain and discomfort Wear a good support bra to prevent discomfort from breast tenderness. Take warm sitz baths to soothe any pain or discomfort caused by hemorrhoids. Use hemorrhoid cream if your health care provider approves. Rest with your legs elevated if you have leg cramps or low back pain. If you develop varicose veins, wear support hose. Elevate your feet for 15 minutes, 3-4 times a day. Limit salt in your diet. Prenatal Care Write down your questions. Take them to your prenatal visits. Keep all your prenatal visits as told by your health  care provider. This is important. Safety Wear your seat belt at all times when driving. Make a list of emergency phone numbers, including numbers for family, friends, the hospital, and police and fire departments. General instructions Ask your health care provider for a referral to a local prenatal education class. Begin classes no later than the beginning of month 6 of your pregnancy. Ask for help if you have counseling or nutritional needs during pregnancy. Your health care provider can offer advice or refer you to specialists for help with various needs. Do not use hot tubs, steam rooms, or saunas. Do not douche or use tampons or scented sanitary pads. Do not cross your legs for long periods of time. Avoid cat litter boxes and soil used by cats. These carry germs that can cause birth defects in the baby and possibly loss of the   fetus by miscarriage or stillbirth. Avoid all smoking, herbs, alcohol, and unprescribed drugs. Chemicals in these products can affect the formation and growth of the baby. Do not use any products that contain nicotine or tobacco, such as cigarettes and e-cigarettes. If you need help quitting, ask your health care provider. Visit your dentist if you have not gone yet during your pregnancy. Use a soft toothbrush to brush your teeth and be gentle when you floss. Contact a health care provider if: You have dizziness. You have mild pelvic cramps, pelvic pressure, or nagging pain in the abdominal area. You have persistent nausea, vomiting, or diarrhea. You have a bad smelling vaginal discharge. You have pain when you urinate. Get help right away if: You have a fever. You are leaking fluid from your vagina. You have spotting or bleeding from your vagina. You have severe abdominal cramping or pain. You have rapid weight gain or weight loss. You have shortness of breath with chest pain. You notice sudden or extreme swelling of your face, hands, ankles, feet, or legs. You  have not felt your baby move in over an hour. You have severe headaches that do not go away when you take medicine. You have vision changes. Summary The second trimester is from week 14 through week 27 (months 4 through 6). It is also a time when the fetus is growing rapidly. Your body goes through many changes during pregnancy. The changes vary from woman to woman. Avoid all smoking, herbs, alcohol, and unprescribed drugs. These chemicals affect the formation and growth your baby. Do not use any tobacco products, such as cigarettes, chewing tobacco, and e-cigarettes. If you need help quitting, ask your health care provider. Contact your health care provider if you have any questions. Keep all prenatal visits as told by your health care provider. This is important. This information is not intended to replace advice given to you by your health care provider. Make sure you discuss any questions you have with your health care provider. Document Released: 02/13/2001 Document Revised: 07/28/2015 Document Reviewed: 04/22/2012 [employer] Interactive Patient Education  2017 [employer]  

## 2022-07-09 ENCOUNTER — Encounter: Payer: Self-pay | Admitting: Women's Health

## 2022-07-09 ENCOUNTER — Telehealth (INDEPENDENT_AMBULATORY_CARE_PROVIDER_SITE_OTHER): Payer: Medicaid Other | Admitting: Women's Health

## 2022-07-09 VITALS — BP 112/74 | HR 103

## 2022-07-09 DIAGNOSIS — Z3A23 23 weeks gestation of pregnancy: Secondary | ICD-10-CM

## 2022-07-09 DIAGNOSIS — Z3482 Encounter for supervision of other normal pregnancy, second trimester: Secondary | ICD-10-CM

## 2022-07-09 DIAGNOSIS — Z3480 Encounter for supervision of other normal pregnancy, unspecified trimester: Secondary | ICD-10-CM

## 2022-07-09 NOTE — Patient Instructions (Signed)
Frances [name], thank you for choosing our office today! We appreciate the opportunity to meet your healthcare needs. You may receive a short survey by mail, e-mail, or through [employer]. If you are happy with your care we would appreciate if you could take just a few minutes to complete the survey questions. We read all of your comments and take your feedback very seriously. Thank you again for choosing our office.  Center for [employer] Team at [facility]  [facility] at [facility] ([address] [place], [place] [address]) Entrance C, located off of [address] Free 24/7 valet parking   You will have your sugar test next visit.  Please do not eat or drink anything after midnight the night before you come, not even water.  You will be here for at least two hours.  Please make an appointment online for the bloodwork at [email or web address] for 8:00am (or as close to this as possible). Make sure you select the [facility] service center.   CLASSES: Go to [email or web address] to register for classes (childbirth, breastfeeding, waterbirth, infant CPR, daddy bootcamp, etc.)  Call the office [phone]) or go to [facility] if: You begin to have strong, frequent contractions Your water breaks.  Sometimes it is a big gush of fluid, sometimes it is just a trickle that keeps getting your panties wet or running down your legs You have vaginal bleeding.  It is normal to have a small amount of spotting if your cervix was checked.  You don't feel your baby moving like normal.  If you don't, get you something to eat and drink and lay down and focus on feeling your baby move.   If your baby is still not moving like normal, you should call the office or go to [facility].  Call the office [phone]) or go to [facility] hospital for these signs of pre-eclampsia: Severe headache that does not go away with Tylenol Visual changes- seeing spots, double, blurred vision Pain under your right breast or  upper abdomen that does not go away with Tums or heartburn medicine Nausea and/or vomiting Severe swelling in your hands, feet, and face    [place] Pediatricians/Family Doctors [facility]): [address] Colette Ribas, [phone]           [facility]: [address], [phone]                [facility]): [address], [phone]  [facility] Department: [address], [place], [phone]    [facility] Pediatricians/Family Doctors [facility]): [address], [phone] [facility]: [address] [place], [phone] [facility]: [address], [phone]  [facility] Doctors  [facility]): [phone] [facility]: [address], [phone]   [facility] Doctors [facility]: [address] [phone]  [facility] Doctors  [facility]: [phone], [phone]  Home Blood Pressure Monitoring for Patients   Your provider has recommended that you check your blood pressure (BP) at least once a week at home. If you do not have a blood pressure cuff at home, one will be provided for you. Contact your provider if you have not received your monitor within 1 week.   Helpful Tips for Accurate Home Blood Pressure Checks  Don't smoke, exercise, or drink caffeine 30 minutes before checking  your BP Use the restroom before checking your BP (a full bladder can raise your pressure) Relax in a comfortable upright chair Feet on the ground Left arm resting comfortably on a flat surface at the level of your heart Legs uncrossed Back supported Sit quietly and don't talk Place the cuff on your bare arm Adjust snuggly, so that only two fingertips can fit between your skin and the top of the cuff Check 2 readings separated by at least one  minute Keep a log of your BP readings For a visual, please reference this diagram: http://ccnc.care/bpdiagram  Provider Name: [facility]     Phone: [phone]  Zone 1: ALL CLEAR  Continue to monitor your symptoms:  BP reading is less than 140 (top number) or less than 90 (bottom number)  No right upper stomach pain No headaches or seeing spots No feeling nauseated or throwing up No swelling in face and hands  Zone 2: CAUTION Call your doctor's office for any of the following:  BP reading is greater than 140 (top number) or greater than 90 (bottom number)  Stomach pain under your ribs in the middle or right side Headaches or seeing spots Feeling nauseated or throwing up Swelling in face and hands  Zone 3: EMERGENCY  Seek immediate medical care if you have any of the following:  BP reading is greater than160 (top number) or greater than 110 (bottom number) Severe headaches not improving with Tylenol Serious difficulty catching your breath Any worsening symptoms from Zone 2   Second Trimester of Pregnancy The second trimester is from week 13 through week 28, months 4 through 6. The second trimester is often a time when you feel your best. Your body has also adjusted to being pregnant, and you begin to feel better physically. Usually, morning sickness has lessened or quit completely, you may have more energy, and you may have an increase in appetite. The second trimester is also a time when the fetus is growing rapidly. At the end of the sixth month, the fetus is about 9 inches long and weighs about 1 pounds. You will likely begin to feel the baby move (quickening) between 18 and 20 weeks of the pregnancy. BODY CHANGES Your body goes through many changes during pregnancy. The changes vary from woman to woman.  Your weight will continue to increase. You will notice your lower abdomen bulging out. You may begin to get stretch marks on your hips, abdomen, and breasts. You may  develop headaches that can be relieved by medicines approved by your health care provider. You may urinate more often because the fetus is pressing on your bladder. You may develop or continue to have heartburn as a result of your pregnancy. You may develop constipation because certain hormones are causing the muscles that push waste through your intestines to slow down. You may develop hemorrhoids or swollen, bulging veins (varicose veins). You may have back pain because of the weight gain and pregnancy hormones relaxing your joints between the bones in your pelvis and as a result of a shift in weight and the muscles that support your balance. Your breasts will continue to grow and be tender. Your gums may bleed and may be sensitive to brushing and flossing. Dark spots or blotches (chloasma, mask of pregnancy) may develop on your face. This will likely fade after the baby is born. A dark line from your belly button to the pubic area (linea nigra) may appear. This will likely fade after the  baby is born. You may have changes in your hair. These can include thickening of your hair, rapid growth, and changes in texture. Some women also have hair loss during or after pregnancy, or hair that feels dry or thin. Your hair will most likely return to normal after your baby is born. WHAT TO EXPECT AT YOUR PRENATAL VISITS During a routine prenatal visit: You will be weighed to make sure you and the fetus are growing normally. Your blood pressure will be taken. Your abdomen will be measured to track your baby's growth. The fetal heartbeat will be listened to. Any test results from the previous visit will be discussed. Your health care provider may ask you: How you are feeling. If you are feeling the baby move. If you have had any abnormal symptoms, such as leaking fluid, bleeding, severe headaches, or abdominal cramping. If you have any questions. Other tests that may be performed during your second  trimester include: Blood tests that check for: Low iron levels (anemia). Gestational diabetes (between 24 and 28 weeks). Rh antibodies. Urine tests to check for infections, diabetes, or protein in the urine. An ultrasound to confirm the proper growth and development of the baby. An amniocentesis to check for possible genetic problems. Fetal screens for spina bifida and Down syndrome. HOME CARE INSTRUCTIONS  Avoid all smoking, herbs, alcohol, and unprescribed drugs. These chemicals affect the formation and growth of the baby. Follow your health care provider's instructions regarding medicine use. There are medicines that are either safe or unsafe to take during pregnancy. Exercise only as directed by your health care provider. Experiencing uterine cramps is a good sign to stop exercising. Continue to eat regular, healthy meals. Wear a good support bra for breast tenderness. Do not use hot tubs, steam rooms, or saunas. Wear your seat belt at all times when driving. Avoid raw meat, uncooked cheese, cat litter boxes, and soil used by cats. These carry germs that can cause birth defects in the baby. Take your prenatal vitamins. Try taking a stool softener (if your health care provider approves) if you develop constipation. Eat more high-fiber foods, such as fresh vegetables or fruit and whole grains. Drink plenty of fluids to keep your urine clear or pale yellow. Take warm sitz baths to soothe any pain or discomfort caused by hemorrhoids. Use hemorrhoid cream if your health care provider approves. If you develop varicose veins, wear support hose. Elevate your feet for 15 minutes, 3-4 times a day. Limit salt in your diet. Avoid heavy lifting, wear low heel shoes, and practice good posture. Rest with your legs elevated if you have leg cramps or low back pain. Visit your dentist if you have not gone yet during your pregnancy. Use a soft toothbrush to brush your teeth and be gentle when you floss. A  sexual relationship may be continued unless your health care provider directs you otherwise. Continue to go to all your prenatal visits as directed by your health care provider. SEEK MEDICAL CARE IF:  You have dizziness. You have mild pelvic cramps, pelvic pressure, or nagging pain in the abdominal area. You have persistent nausea, vomiting, or diarrhea. You have a bad smelling vaginal discharge. You have pain with urination. SEEK IMMEDIATE MEDICAL CARE IF:  You have a fever. You are leaking fluid from your vagina. You have spotting or bleeding from your vagina. You have severe abdominal cramping or pain. You have rapid weight gain or loss. You have shortness of breath with chest pain. You  notice sudden or extreme swelling of your face, hands, ankles, feet, or legs. You have not felt your baby move in over an hour. You have severe headaches that do not go away with medicine. You have vision changes. Document Released: 02/13/2001 Document Revised: 02/24/2013 Document Reviewed: 04/22/2012 [facility] Patient Information 2015 [place], [place]. This information is not intended to replace advice given to you by your health care provider. Make sure you discuss any questions you have with your health care provider.

## 2022-07-09 NOTE — Progress Notes (Signed)
TELEHEALTH VIRTUAL OBSTETRICS VISIT ENCOUNTER NOTE Patient name: Frances [name] MRN [id number]  Date of birth: 11-Sep-2000  I connected with patient on 07/09/22 at  9:10 AM EDT by MyChart video  and verified that I am speaking with the correct person using two identifiers. Pt is not currently in our office, she is at home.  The provider is in the office.    I discussed the limitations, risks, security and privacy concerns of performing an evaluation and management service by telephone and the availability of in person appointments. I also discussed with the patient that there may be a patient responsible charge related to this service. The patient expressed understanding and agreed to proceed.  Chief Complaint:   Routine Prenatal Visit  History of Present Illness:   Frances [name] is a 22 y.o. [phone] female at [redacted]w[redacted]d with an Estimated Date of Delivery: 11/03/22 being evaluated today for ongoing management of a low-risk pregnancy.     04/23/2022   10:31 AM 04/25/2020    9:40 AM 12/30/2019    2:58 PM  Depression screen PHQ 2/9  Decreased Interest 0 1 2  Down, Depressed, Hopeless 0 0 2  PHQ - 2 Score 0 1 4  Altered sleeping 0 1 2  Tired, decreased energy 0 1 2  Change in appetite 0 1 2  Feeling bad or failure about yourself  0 0 0  Trouble concentrating 0 0 0  Moving slowly or fidgety/restless 0 0 2  Suicidal thoughts 0 0 0  PHQ-9 Score 0 4 12    Today she reports  husband wants her to ask about low sex drive. Pt states it's a '0'. Not on any meds that would cause, does have dep/anx- but feels she's doing well w/ this. Not sleeping well at night d/t hip pain. . Contractions: Not present. Vag. Bleeding: None.  Movement: Present. denies leaking of fluid. Review of Systems:   Pertinent items are noted in HPI Denies abnormal vaginal discharge w/ itching/odor/irritation, headaches, visual changes, shortness of breath, chest pain, abdominal pain, severe nausea/vomiting, or problems with  urination or bowel movements unless otherwise stated above. Pertinent History Reviewed:  Reviewed past medical,surgical, social, obstetrical and family history.  Reviewed problem list, medications and allergies. Physical Assessment:   Vitals:   07/09/22 0941  BP: 112/74  Pulse: (!) 103  There is no height or weight on file to calculate BMI.        Physical Examination:   General:  Alert, oriented and cooperative.   Mental Status: Normal mood and affect perceived. Normal judgment and thought content.  Rest of physical exam deferred due to type of encounter  No results found for this or any previous visit (from the past 24 hour(s)).  Assessment & Plan:  1) Pregnancy G3P1102 at [redacted]w[redacted]d with an Estimated Date of Delivery: 11/03/22   2) Low sex drive, discussed  3) Hip pain> causing poor sleep (which in turn may be part of the lower sex drive), reviewed relief measures   Meds: No orders of the defined types were placed in this encounter.  Labs/procedures today: none  Plan:  Continue routine obstetrical care.  Does have home bp cuff. Office bp cuff given: not applicable. Check bp weekly, let us know if consistently >140 and/or >90.  Next visit: prefers will be in person for pn2     Reviewed: Preterm labor symptoms and general obstetric precautions including but not limited to vaginal bleeding, contractions, leaking of fluid and fetal movement were  reviewed in detail with the patient. The patient was advised to call back or seek an in-person office evaluation/go to [facility] for any urgent or concerning symptoms. All questions were answered. Please refer to After Visit Summary for other counseling recommendations.    I provided 10 minutes of non-face-to-face time during this encounter.  Follow-up: Return in about 4 weeks (around 08/06/2022) for LROB, PN2, CNM, in person.  No orders of the defined types were placed in this encounter.  Cheral Marker CNM,  [facility] 07/09/2022 9:58 AM

## 2022-08-06 ENCOUNTER — Encounter: Payer: Self-pay | Admitting: Women's Health

## 2022-08-06 ENCOUNTER — Ambulatory Visit (INDEPENDENT_AMBULATORY_CARE_PROVIDER_SITE_OTHER): Payer: Medicaid Other | Admitting: Women's Health

## 2022-08-06 ENCOUNTER — Other Ambulatory Visit: Payer: Medicaid Other

## 2022-08-06 VITALS — BP 103/66 | HR 83 | Wt 174.0 lb

## 2022-08-06 DIAGNOSIS — Z23 Encounter for immunization: Secondary | ICD-10-CM

## 2022-08-06 DIAGNOSIS — Z131 Encounter for screening for diabetes mellitus: Secondary | ICD-10-CM

## 2022-08-06 DIAGNOSIS — Z3A27 27 weeks gestation of pregnancy: Secondary | ICD-10-CM

## 2022-08-06 DIAGNOSIS — Z3482 Encounter for supervision of other normal pregnancy, second trimester: Secondary | ICD-10-CM

## 2022-08-06 DIAGNOSIS — Z3480 Encounter for supervision of other normal pregnancy, unspecified trimester: Secondary | ICD-10-CM

## 2022-08-06 NOTE — Progress Notes (Signed)
LOW-RISK PREGNANCY VISIT Patient name: Frances [name] MRN [id number]  Date of birth: 2000/09/11 Chief Complaint:   Routine Prenatal Visit (PN2/tdap today)  History of Present Illness:   Frances [name] is a 22 y.o. [phone] female at [redacted]w[redacted]d with an Estimated Date of Delivery: 11/03/22 being seen today for ongoing management of a low-risk pregnancy.   Today she reports no complaints. Contractions: Not present.  .  Movement: Present. denies leaking of fluid.     04/23/2022   10:31 AM 04/25/2020    9:40 AM 12/30/2019    2:58 PM  Depression screen PHQ 2/9  Decreased Interest 0 1 2  Down, Depressed, Hopeless 0 0 2  PHQ - 2 Score 0 1 4  Altered sleeping 0 1 2  Tired, decreased energy 0 1 2  Change in appetite 0 1 2  Feeling bad or failure about yourself  0 0 0  Trouble concentrating 0 0 0  Moving slowly or fidgety/restless 0 0 2  Suicidal thoughts 0 0 0  PHQ-9 Score 0 4 12        04/23/2022   10:31 AM 04/25/2020    9:41 AM 12/30/2019    2:58 PM  GAD 7 : Generalized Anxiety Score  Nervous, Anxious, on Edge 0 1 2  Control/stop worrying 0 0 2  Worry too much - different things 0 0 2  Trouble relaxing 0 0 2  Restless 0 0 2  Easily annoyed or irritable 0 1 2  Afraid - awful might happen 0 0 2  Total GAD 7 Score 0 2 14      Review of Systems:   Pertinent items are noted in HPI Denies abnormal vaginal discharge w/ itching/odor/irritation, headaches, visual changes, shortness of breath, chest pain, abdominal pain, severe nausea/vomiting, or problems with urination or bowel movements unless otherwise stated above. Pertinent History Reviewed:  Reviewed past medical,surgical, social, obstetrical and family history.  Reviewed problem list, medications and allergies. Physical Assessment:   Vitals:   08/06/22 0904  BP: 103/66  Pulse: 83  Weight: 174 lb (78.9 kg)  Body mass index is 30.82 kg/m.        Physical Examination:   General appearance: Well appearing, and in no  distress  Mental status: Alert, oriented to person, place, and time  Skin: Warm & dry  Cardiovascular: Normal heart rate noted  Respiratory: Normal respiratory effort, no distress  Abdomen: Soft, gravid, nontender  Pelvic: Cervical exam deferred         Extremities: Edema: None  Fetal Status: Fetal Heart Rate (bpm): 136 Fundal Height: 28 cm Movement: Present    Chaperone: N/A   No results found for this or any previous visit (from the past 24 hour(s)).  Assessment & Plan:  1) Low-risk pregnancy G3P1102 at 108w2d with an Estimated Date of Delivery: 11/03/22   2) H/O 36wk PTB, reviewed ptl s/s, reasons to seek care   Meds: No orders of the defined types were placed in this encounter.  Labs/procedures today: tdap and PN2  Plan:  Continue routine obstetrical care  Next visit: prefers online    Reviewed: Preterm labor symptoms and general obstetric precautions including but not limited to vaginal bleeding, contractions, leaking of fluid and fetal movement were reviewed in detail with the patient.  All questions were answered. Does have home bp cuff. Office bp cuff given: not applicable. Check bp weekly, let us know if consistently >140 and/or >90.  Follow-up: Return in about 3 weeks (around 08/27/2022)  for LROB, CNM, MyChart Video.  Future Appointments  Date Time Provider Department Center  08/06/2022 10:10 AM Cheral Marker, CNM [facility]  08/27/2022  1:30 PM Cresenzo-Dishmon, Scarlette Calico, CNM [facility]    Orders Placed This Encounter  Procedures   Tdap vaccine greater than or equal to 7yo IM   Cheral Marker CNM, [facility] 08/06/2022 9:33 AM

## 2022-08-06 NOTE — Patient Instructions (Signed)
Lorali, thank you for choosing our office today! We appreciate the opportunity to meet your healthcare needs. You may receive a short survey by mail, e-mail, or through [employer]. If you are happy with your care we would appreciate if you could take just a few minutes to complete the survey questions. We read all of your comments and take your feedback very seriously. Thank you again for choosing our office.  Center for [employer] Team at [facility]  [facility] at [facility] ([address] [place], [place] [address]) Entrance C, located off of [address] Free 24/7 valet parking   CLASSES: Go to [email or web address] to register for classes (childbirth, breastfeeding, waterbirth, infant CPR, daddy bootcamp, etc.)  Call the office [phone]) or go to [facility] if: You begin to have strong, frequent contractions Your water breaks.  Sometimes it is a big gush of fluid, sometimes it is just a trickle that keeps getting your panties wet or running down your legs You have vaginal bleeding.  It is normal to have a small amount of spotting if your cervix was checked.  You don't feel your baby moving like normal.  If you don't, get you something to eat and drink and lay down and focus on feeling your baby move.   If your baby is still not moving like normal, you should call the office or go to [facility].  Call the office [phone]) or go to [facility] hospital for these signs of pre-eclampsia: Severe headache that does not go away with Tylenol Visual changes- seeing spots, double, blurred vision Pain under your right breast or upper abdomen that does not go away with Tums or heartburn medicine Nausea and/or vomiting Severe swelling in your hands, feet, and face   Tdap Vaccine It is recommended that you get the Tdap vaccine during the third trimester of EACH pregnancy to help protect your baby from getting pertussis (whooping cough) 27-36 weeks is the BEST time to do  this so that you can pass the protection on to your baby. During pregnancy is better than after pregnancy, but if you are unable to get it during pregnancy it will be offered at the hospital.  You can get this vaccine with [place], at the health department, your family doctor, or some local pharmacies Everyone who will be around your baby should also be up-to-date on their vaccines before the baby comes. Adults (who are not pregnant) only need 1 dose of Tdap during adulthood.   [facility] Pediatricians/Family Doctors [facility]): [address] Colette Ribas, [phone]           [facility]: [address], [phone]                [facility] [address], [phone] (call to ask if accepting patients) [facility] Department: [address], [place], [phone]    [facility] Pediatricians/Family Doctors [facility]): [phone] [address], [phone] [facility]: [address] [place], [phone] [facility]: [address], [phone]  [facility] Doctors  [facility]): [phone] [facility]: [address], [phone]   [facility] Doctors [facility]: [address] [phone]  [facility] Family Doctors  [facility]: [phone], [phone]  Home Blood Pressure Monitoring for Patients   Your provider has recommended that you check your  blood pressure (BP) at least once a week at home. If you do not have a blood pressure cuff at home, one will be provided for you. Contact your provider if you have not received your monitor within 1 week.   Helpful Tips for Accurate Home Blood Pressure Checks  Don't smoke, exercise, or drink caffeine 30 minutes before checking your BP Use the restroom before checking your BP (a full bladder can raise your  pressure) Relax in a comfortable upright chair Feet on the ground Left arm resting comfortably on a flat surface at the level of your heart Legs uncrossed Back supported Sit quietly and don't talk Place the cuff on your bare arm Adjust snuggly, so that only two fingertips can fit between your skin and the top of the cuff Check 2 readings separated by at least one minute Keep a log of your BP readings For a visual, please reference this diagram: http://ccnc.care/bpdiagram  Provider Name: [facility]     Phone: [phone]  Zone 1: ALL CLEAR  Continue to monitor your symptoms:  BP reading is less than 140 (top number) or less than 90 (bottom number)  No right upper stomach pain No headaches or seeing spots No feeling nauseated or throwing up No swelling in face and hands  Zone 2: CAUTION Call your doctor's office for any of the following:  BP reading is greater than 140 (top number) or greater than 90 (bottom number)  Stomach pain under your ribs in the middle or right side Headaches or seeing spots Feeling nauseated or throwing up Swelling in face and hands  Zone 3: EMERGENCY  Seek immediate medical care if you have any of the following:  BP reading is greater than160 (top number) or greater than 110 (bottom number) Severe headaches not improving with Tylenol Serious difficulty catching your breath Any worsening symptoms from Zone 2   Third Trimester of Pregnancy The third trimester is from week 29 through week 42, months 7 through 9. The third trimester is a time when the fetus is growing rapidly. At the end of the ninth month, the fetus is about 20 inches in length and weighs 6-10 pounds.  BODY CHANGES Your body goes through many changes during pregnancy. The changes vary from woman to woman.  Your weight will continue to increase. You can expect to gain 25-35 pounds (11-16 kg) by the end of the pregnancy. You may begin to get stretch marks on your hips, abdomen,  and breasts. You may urinate more often because the fetus is moving lower into your pelvis and pressing on your bladder. You may develop or continue to have heartburn as a result of your pregnancy. You may develop constipation because certain hormones are causing the muscles that push waste through your intestines to slow down. You may develop hemorrhoids or swollen, bulging veins (varicose veins). You may have pelvic pain because of the weight gain and pregnancy hormones relaxing your joints between the bones in your pelvis. Backaches may result from overexertion of the muscles supporting your posture. You may have changes in your hair. These can include thickening of your hair, rapid growth, and changes in texture. Some women also have hair loss during or after pregnancy, or hair that feels dry or thin. Your hair will most likely return to normal after your baby is born. Your breasts will continue to grow and be tender. A yellow discharge may leak from your breasts called colostrum. Your belly button may stick out. You may  feel short of breath because of your expanding uterus. You may notice the fetus "dropping," or moving lower in your abdomen. You may have a bloody mucus discharge. This usually occurs a few days to a week before labor begins. Your cervix becomes thin and soft (effaced) near your due date. WHAT TO EXPECT AT YOUR PRENATAL EXAMS  You will have prenatal exams every 2 weeks until week 36. Then, you will have weekly prenatal exams. During a routine prenatal visit: You will be weighed to make sure you and the fetus are growing normally. Your blood pressure is taken. Your abdomen will be measured to track your baby's growth. The fetal heartbeat will be listened to. Any test results from the previous visit will be discussed. You may have a cervical check near your due date to see if you have effaced. At around 36 weeks, your caregiver will check your cervix. At the same time, your  caregiver will also perform a test on the secretions of the vaginal tissue. This test is to determine if a type of bacteria, Group B streptococcus, is present. Your caregiver will explain this further. Your caregiver may ask you: What your birth plan is. How you are feeling. If you are feeling the baby move. If you have had any abnormal symptoms, such as leaking fluid, bleeding, severe headaches, or abdominal cramping. If you have any questions. Other tests or screenings that may be performed during your third trimester include: Blood tests that check for low iron levels (anemia). Fetal testing to check the health, activity level, and growth of the fetus. Testing is done if you have certain medical conditions or if there are problems during the pregnancy. FALSE LABOR You may feel small, irregular contractions that eventually go away. These are called Braxton Hicks contractions, or false labor. Contractions may last for hours, days, or even weeks before true labor sets in. If contractions come at regular intervals, intensify, or become painful, it is best to be seen by your caregiver.  SIGNS OF LABOR  Menstrual-like cramps. Contractions that are 5 minutes apart or less. Contractions that start on the top of the uterus and spread down to the lower abdomen and back. A sense of increased pelvic pressure or back pain. A watery or bloody mucus discharge that comes from the vagina. If you have any of these signs before the 37th week of pregnancy, call your caregiver right away. You need to go to the hospital to get checked immediately. HOME CARE INSTRUCTIONS  Avoid all smoking, herbs, alcohol, and unprescribed drugs. These chemicals affect the formation and growth of the baby. Follow your caregiver's instructions regarding medicine use. There are medicines that are either safe or unsafe to take during pregnancy. Exercise only as directed by your caregiver. Experiencing uterine cramps is a good sign to  stop exercising. Continue to eat regular, healthy meals. Wear a good support bra for breast tenderness. Do not use hot tubs, steam rooms, or saunas. Wear your seat belt at all times when driving. Avoid raw meat, uncooked cheese, cat litter boxes, and soil used by cats. These carry germs that can cause birth defects in the baby. Take your prenatal vitamins. Try taking a stool softener (if your caregiver approves) if you develop constipation. Eat more high-fiber foods, such as fresh vegetables or fruit and whole grains. Drink plenty of fluids to keep your urine clear or pale yellow. Take warm sitz baths to soothe any pain or discomfort caused by hemorrhoids. Use hemorrhoid cream if  your caregiver approves. If you develop varicose veins, wear support hose. Elevate your feet for 15 minutes, 3-4 times a day. Limit salt in your diet. Avoid heavy lifting, wear low heal shoes, and practice good posture. Rest a lot with your legs elevated if you have leg cramps or low back pain. Visit your dentist if you have not gone during your pregnancy. Use a soft toothbrush to brush your teeth and be gentle when you floss. A sexual relationship may be continued unless your caregiver directs you otherwise. Do not travel far distances unless it is absolutely necessary and only with the approval of your caregiver. Take prenatal classes to understand, practice, and ask questions about the labor and delivery. Make a trial run to the hospital. Pack your hospital bag. Prepare the baby's nursery. Continue to go to all your prenatal visits as directed by your caregiver. SEEK MEDICAL CARE IF: You are unsure if you are in labor or if your water has broken. You have dizziness. You have mild pelvic cramps, pelvic pressure, or nagging pain in your abdominal area. You have persistent nausea, vomiting, or diarrhea. You have a bad smelling vaginal discharge. You have pain with urination. SEEK IMMEDIATE MEDICAL CARE IF:  You  have a fever. You are leaking fluid from your vagina. You have spotting or bleeding from your vagina. You have severe abdominal cramping or pain. You have rapid weight loss or gain. You have shortness of breath with chest pain. You notice sudden or extreme swelling of your face, hands, ankles, feet, or legs. You have not felt your baby move in over an hour. You have severe headaches that do not go away with medicine. You have vision changes. Document Released: 02/13/2001 Document Revised: 02/24/2013 Document Reviewed: 04/22/2012 [facility] Patient Information 2015 [place], [place]. This information is not intended to replace advice given to you by your health care provider. Make sure you discuss any questions you have with your health care provider.

## 2022-08-07 LAB — CBC
Hematocrit: 34.5 % (ref 34.0–46.6)
Hemoglobin: 11.3 g/dL (ref 11.1–15.9)
MCH: 27.6 pg (ref 26.6–33.0)
MCHC: 32.8 g/dL (ref 31.5–35.7)
MCV: 84 fL (ref 79–97)
Platelets: 227 10*3/uL (ref 150–450)
RBC: 4.1 x10E6/uL (ref 3.77–5.28)
RDW: 12.9 % (ref 11.7–15.4)
WBC: 11.4 10*3/uL — ABNORMAL HIGH (ref 3.4–10.8)

## 2022-08-07 LAB — GLUCOSE TOLERANCE, 2 HOURS W/ 1HR
Glucose, 1 hour: 111 mg/dL (ref 70–179)
Glucose, 2 hour: 102 mg/dL (ref 70–152)
Glucose, Fasting: 72 mg/dL (ref 70–91)

## 2022-08-07 LAB — HIV ANTIBODY (ROUTINE TESTING W REFLEX): HIV Screen 4th Generation wRfx: NONREACTIVE

## 2022-08-07 LAB — RPR: RPR Ser Ql: NONREACTIVE

## 2022-08-07 LAB — ANTIBODY SCREEN: Antibody Screen: NEGATIVE

## 2022-08-14 ENCOUNTER — Encounter: Payer: Self-pay | Admitting: Women's Health

## 2022-08-27 ENCOUNTER — Telehealth: Payer: Medicaid Other | Admitting: Advanced Practice Midwife

## 2022-08-28 ENCOUNTER — Encounter: Payer: Self-pay | Admitting: Women's Health

## 2022-08-31 ENCOUNTER — Telehealth (INDEPENDENT_AMBULATORY_CARE_PROVIDER_SITE_OTHER): Payer: Medicaid Other | Admitting: Family Medicine

## 2022-08-31 ENCOUNTER — Encounter: Payer: Self-pay | Admitting: Family Medicine

## 2022-08-31 VITALS — BP 108/70 | HR 77

## 2022-08-31 DIAGNOSIS — Z3480 Encounter for supervision of other normal pregnancy, unspecified trimester: Secondary | ICD-10-CM

## 2022-08-31 DIAGNOSIS — Z3483 Encounter for supervision of other normal pregnancy, third trimester: Secondary | ICD-10-CM

## 2022-08-31 DIAGNOSIS — Z3A30 30 weeks gestation of pregnancy: Secondary | ICD-10-CM

## 2022-08-31 DIAGNOSIS — Z8751 Personal history of pre-term labor: Secondary | ICD-10-CM

## 2022-08-31 NOTE — Progress Notes (Signed)
   OBSTETRICS PRENATAL VIRTUAL VISIT ENCOUNTER NOTE  Provider location: [facility]   Patient location: Home  I connected with Frances [name] on 08/31/22 at 10:30 AM EDT by MyChart Video Encounter and verified that I am speaking with the correct person using two identifiers. I discussed the limitations, risks, security and privacy concerns of performing an evaluation and management service virtually and the availability of in person appointments. I also discussed with the patient that there may be a patient responsible charge related to this service. The patient expressed understanding and agreed to proceed. Subjective:  Frances [name] is a 22 y.o. G3P1102 at [redacted]w[redacted]d being seen today for ongoing prenatal care.  She is currently monitored for the following issues for this low-risk pregnancy and has Oppositional defiant disorder; ADHD (attention deficit hyperactivity disorder), combined type; Insomnia; Nocturia; Severe recurrent major depression without psychotic features (HCC); HSV-2 infection; Calculus of gallbladder without cholecystitis without obstruction; Encounter for supervision of normal pregnancy, antepartum; and History of preterm delivery on their problem list.  Patient reports no complaints.  Contractions: Not present. Vag. Bleeding: None.  Movement: Present. Denies any leaking of fluid.   The following portions of the patient's history were reviewed and updated as appropriate: allergies, current medications, past family history, past medical history, past social history, past surgical history and problem list.   Objective:   Vitals:   08/31/22 1029  BP: 108/70  Pulse: 77    Fetal Status:     Movement: Present     General:  Alert, oriented and cooperative. Patient is in no acute distress.  Respiratory: Normal respiratory effort, no problems with respiration noted  Mental Status: Normal mood and affect. Normal behavior. Normal judgment and  thought content.  Rest of physical exam deferred due to type of encounter  Imaging: No results found.  Assessment and Plan:  Pregnancy: G3P1102 at [redacted]w[redacted]d 1. Supervision of other normal pregnancy, antepartum Up to date Doing well and feeling normal movement Reports pulling a muscle last week and trying to take it easy  2. History of preterm delivery   Preterm labor symptoms and general obstetric precautions including but not limited to vaginal bleeding, contractions, leaking of fluid and fetal movement were reviewed in detail with the patient. I discussed the assessment and treatment plan with the patient. The patient was provided an opportunity to ask questions and all were answered. The patient agreed with the plan and demonstrated an understanding of the instructions. The patient was advised to call back or seek an in-person office evaluation/go to [facility] at [facility] for any urgent or concerning symptoms. Please refer to After Visit Summary for other counseling recommendations.   I provided 10 minutes of face-to-face time during this encounter.  Return in about 2 weeks (around 09/14/2022) for Routine prenatal care, Telehealth/Virtual health OB Visit if desired.  No future appointments.  Federico Flake, MD Center for [employer], [facility]

## 2022-09-05 ENCOUNTER — Encounter: Payer: Self-pay | Admitting: Women's Health

## 2022-09-13 ENCOUNTER — Ambulatory Visit (INDEPENDENT_AMBULATORY_CARE_PROVIDER_SITE_OTHER): Payer: Medicaid Other | Admitting: Obstetrics & Gynecology

## 2022-09-13 ENCOUNTER — Encounter: Payer: Self-pay | Admitting: Obstetrics & Gynecology

## 2022-09-13 VITALS — BP 118/79 | HR 101 | Wt 182.0 lb

## 2022-09-13 DIAGNOSIS — Z3480 Encounter for supervision of other normal pregnancy, unspecified trimester: Secondary | ICD-10-CM

## 2022-09-13 NOTE — Progress Notes (Signed)
   LOW-RISK PREGNANCY VISIT Patient name: Frances [name] MRN [id number]  Date of birth: 12/08/2000 Chief Complaint:   Routine Prenatal Visit  History of Present Illness:   Frances [name] is a 22 y.o. G102P1102 female at [redacted]w[redacted]d with an Estimated Date of Delivery: 11/03/22 being seen today for ongoing management of a low-risk pregnancy.     08/06/2022    9:42 AM 04/23/2022   10:31 AM 04/25/2020    9:40 AM 12/30/2019    2:58 PM  Depression screen PHQ 2/9  Decreased Interest 0 0 1 2  Down, Depressed, Hopeless 0 0 0 2  PHQ - 2 Score 0 0 1 4  Altered sleeping 1 0 1 2  Tired, decreased energy 1 0 1 2  Change in appetite 0 0 1 2  Feeling bad or failure about yourself  0 0 0 0  Trouble concentrating 0 0 0 0  Moving slowly or fidgety/restless 0 0 0 2  Suicidal thoughts 0 0 0 0  PHQ-9 Score 2 0 4 12    Today she reports no complaints. Contractions: Not present. Vag. Bleeding: None.  Movement: Present. denies leaking of fluid. Review of Systems:   Pertinent items are noted in HPI Denies abnormal vaginal discharge w/ itching/odor/irritation, headaches, visual changes, shortness of breath, chest pain, abdominal pain, severe nausea/vomiting, or problems with urination or bowel movements unless otherwise stated above. Pertinent History Reviewed:  Reviewed past medical,surgical, social, obstetrical and family history.  Reviewed problem list, medications and allergies. Physical Assessment:   Vitals:   09/13/22 1103  BP: 118/79  Pulse: (!) 101  Weight: 182 lb (82.6 kg)  Body mass index is 32.24 kg/m.        Physical Examination:   General appearance: Well appearing, and in no distress  Mental status: Alert, oriented to person, place, and time  Skin: Warm & dry  Cardiovascular: Normal heart rate noted  Respiratory: Normal respiratory effort, no distress  Abdomen: Soft, gravid, nontender  Pelvic: Cervical exam deferred         Extremities: Edema: None  Fetal Status:     Movement: Present     Chaperone: n/a    No results found for this or any previous visit (from the past 24 hour(s)).  Assessment & Plan:  1) Low-risk pregnancy G3P1102 at [redacted]w[redacted]d with an Estimated Date of Delivery: 11/03/22      Meds: No orders of the defined types were placed in this encounter.  Labs/procedures today:   Plan:  Continue routine obstetrical care  Next visit: prefers in person    Reviewed: Preterm labor symptoms and general obstetric precautions including but not limited to vaginal bleeding, contractions, leaking of fluid and fetal movement were reviewed in detail with the patient.  All questions were answered. Has home bp cuff. Rx faxed to . Check bp weekly, let us know if >140/90.   Follow-up: Return in about 2 weeks (around 09/27/2022) for LROB.  No orders of the defined types were placed in this encounter.   Lazaro Arms, MD 09/13/2022 11:30 AM

## 2022-09-27 ENCOUNTER — Encounter: Payer: Self-pay | Admitting: Advanced Practice Midwife

## 2022-09-27 ENCOUNTER — Ambulatory Visit (INDEPENDENT_AMBULATORY_CARE_PROVIDER_SITE_OTHER): Payer: Medicaid Other | Admitting: Advanced Practice Midwife

## 2022-09-27 VITALS — BP 114/75 | HR 85 | Wt 179.0 lb

## 2022-09-27 DIAGNOSIS — Z3480 Encounter for supervision of other normal pregnancy, unspecified trimester: Secondary | ICD-10-CM

## 2022-09-27 DIAGNOSIS — Z3A34 34 weeks gestation of pregnancy: Secondary | ICD-10-CM

## 2022-09-27 NOTE — Progress Notes (Addendum)
   LOW-RISK PREGNANCY VISIT Patient name: Frances [name] MRN [id number]  Date of birth: [birth year] Chief Complaint:   Routine Prenatal Visit  History of Present Illness:   Frances [name] is a 22 y.o. F6O1308 female at [redacted]w[redacted]d with an Estimated Date of Delivery: 11/03/22 being seen today for ongoing management of a low-risk pregnancy.  Today she reports no complaints. Contractions: Not present. Vag. Bleeding: None.  Movement: Present. denies leaking of fluid. Review of Systems:   Pertinent items are noted in HPI Denies abnormal vaginal discharge w/ itching/odor/irritation, headaches, visual changes, shortness of breath, chest pain, abdominal pain, severe nausea/vomiting, or problems with urination or bowel movements unless otherwise stated above. Pertinent History Reviewed:  Reviewed past medical,surgical, social, obstetrical and family history.  Reviewed problem list, medications and allergies. Physical Assessment:   Vitals:   09/27/22 1600  BP: 114/75  Pulse: 85  Weight: 179 lb (81.2 kg)  Body mass index is 31.71 kg/m.        Physical Examination:   General appearance: Well appearing, and in no distress  Mental status: Alert, oriented to person, place, and time  Skin: Warm & dry  Cardiovascular: Normal heart rate noted  Respiratory: Normal respiratory effort, no distress  Abdomen: Soft, gravid, nontender  Pelvic: Cervical exam deferred         Extremities: Edema: None  Fetal Status: Fetal Heart Rate (bpm): 148 Fundal Height: 35 cm Movement: Present    Chaperone:  N/A    No results found for this or any previous visit (from the past 24 hour(s)).  Assessment & Plan:    Pregnancy: G3P1102 at [redacted]w[redacted]d 1. [redacted] weeks gestation of pregnancy   2. Supervision of other normal pregnancy, antepartum      Meds: No orders of the defined types were placed in this encounter.  Labs/procedures today: none  Plan:  Continue routine obstetrical care  Next visit: prefers in person     Reviewed: Preterm labor symptoms and general obstetric precautions including but not limited to vaginal bleeding, contractions, leaking of fluid and fetal movement were reviewed in detail with the patient.  All questions were answered. Has home bp cuff. . Check bp weekly, let us know if >140/90.   Follow-up: No follow-ups on file.  Future Appointments  Date Time Provider Department Center  10/10/2022  9:10 AM Arabella Merles, CNM [facility]  10/18/2022  9:50 AM Jacklyn Shell, CNM [facility]  10/25/2022  9:50 AM Jacklyn Shell, CNM [facility]  11/01/2022  9:50 AM Cheral Marker, CNM [facility]    No orders of the defined types were placed in this encounter.  Jacklyn Shell DNP, CNM 09/27/2022 4:40 PM

## 2022-10-10 ENCOUNTER — Encounter: Payer: Self-pay | Admitting: Advanced Practice Midwife

## 2022-10-10 ENCOUNTER — Ambulatory Visit (INDEPENDENT_AMBULATORY_CARE_PROVIDER_SITE_OTHER): Payer: Medicaid Other | Admitting: Advanced Practice Midwife

## 2022-10-10 ENCOUNTER — Other Ambulatory Visit (HOSPITAL_COMMUNITY)
Admission: RE | Admit: 2022-10-10 | Discharge: 2022-10-10 | Disposition: A | Discharge status: Home / Self Care | Payer: Medicaid Other | Source: Ambulatory Visit | Attending: Advanced Practice Midwife | Admitting: Advanced Practice Midwife

## 2022-10-10 VITALS — BP 118/82 | HR 103 | Wt 181.0 lb

## 2022-10-10 DIAGNOSIS — Z113 Encounter for screening for infections with a predominantly sexual mode of transmission: Secondary | ICD-10-CM | POA: Insufficient documentation

## 2022-10-10 DIAGNOSIS — B009 Herpesviral infection, unspecified: Secondary | ICD-10-CM

## 2022-10-10 DIAGNOSIS — Z3480 Encounter for supervision of other normal pregnancy, unspecified trimester: Secondary | ICD-10-CM

## 2022-10-10 DIAGNOSIS — Z124 Encounter for screening for malignant neoplasm of cervix: Secondary | ICD-10-CM | POA: Diagnosis present

## 2022-10-10 DIAGNOSIS — Z3A36 36 weeks gestation of pregnancy: Secondary | ICD-10-CM

## 2022-10-10 MED ORDER — ACYCLOVIR 400 MG PO TABS: 400.0000 mg | ORAL_TABLET | Freq: Three times a day (TID) | ORAL | 3 refills | Status: DC

## 2022-10-10 NOTE — Progress Notes (Signed)
   LOW-RISK PREGNANCY VISIT Patient name: Cooper Shue MRN [id number]  Date of birth: 2000-04-28 Chief Complaint:   Routine Prenatal Visit (Pap, cultures)  History of Present Illness:   Wendalyn Wakeham is a 22 y.o. [id number] female at [redacted]w[redacted]d with an Estimated Date of Delivery: 11/03/22 being seen today for ongoing management of a low-risk pregnancy.  Today she reports no complaints. Contractions: Not present. Vag. Bleeding: None.  Movement: Present. denies leaking of fluid. Review of Systems:   Pertinent items are noted in HPI Denies abnormal vaginal discharge w/ itching/odor/irritation, headaches, visual changes, shortness of breath, chest pain, abdominal pain, severe nausea/vomiting, or problems with urination or bowel movements unless otherwise stated above. Pertinent History Reviewed:  Reviewed past medical,surgical, social, obstetrical and family history.  Reviewed problem list, medications and allergies. Physical Assessment:   Vitals:   10/10/22 0922  BP: 118/82  Pulse: (!) 103  Weight: 181 lb (82.1 kg)  Body mass index is 32.06 kg/m.        Physical Examination:   General appearance: Well appearing, and in no distress  Mental status: Alert, oriented to person, place, and time  Skin: Warm & dry  Cardiovascular: Normal heart rate noted  Respiratory: Normal respiratory effort, no distress  Abdomen: Soft, gravid, nontender  Pelvic: Cervical exam deferred         Extremities: Edema: None  Fetal Status: Fetal Heart Rate (bpm): 134 Fundal Height: 37 cm Movement: Present Presentation: Vertex  No results found for this or any previous visit (from the past 24 hour(s)).  Assessment & Plan:  1) Low-risk pregnancy G3P1102 at [redacted]w[redacted]d with an Estimated Date of Delivery: 11/03/22   2) Hx HSV, rx acyclovir tid for suppression   Meds:  Meds ordered this encounter  Medications   acyclovir (ZOVIRAX) 400 MG tablet    Sig: Take 1 tablet (400 mg total) by mouth 3 (three) times daily.     Dispense:  90 tablet    Refill:  3    Order Specific Question:   Supervising Provider    Answer:   Myna Hidalgo [[id number]]   Labs/procedures today: Pap/cultures/GBS  Plan:  Continue routine obstetrical care   Reviewed: Term labor symptoms and general obstetric precautions including but not limited to vaginal bleeding, contractions, leaking of fluid and fetal movement were reviewed in detail with the patient.  All questions were answered. Has home bp cuff. Check bp weekly, let us know if >140/90.   Follow-up: Return for As scheduled.  Orders Placed This Encounter  Procedures   Culture, beta strep (group b only)   Arabella Merles [facility] 10/10/2022 9:41 AM

## 2022-10-17 ENCOUNTER — Inpatient Hospital Stay (HOSPITAL_COMMUNITY)
Admission: AD | Admit: 2022-10-17 | Discharge: 2022-10-17 | Disposition: A | Discharge status: Home / Self Care | Payer: Medicaid Other | Attending: Obstetrics & Gynecology | Admitting: Obstetrics & Gynecology

## 2022-10-17 ENCOUNTER — Encounter (HOSPITAL_COMMUNITY): Payer: Self-pay | Admitting: Obstetrics & Gynecology

## 2022-10-17 DIAGNOSIS — O471 False labor at or after 37 completed weeks of gestation: Secondary | ICD-10-CM | POA: Diagnosis present

## 2022-10-17 DIAGNOSIS — O479 False labor, unspecified: Secondary | ICD-10-CM

## 2022-10-17 DIAGNOSIS — Z3A37 37 weeks gestation of pregnancy: Secondary | ICD-10-CM | POA: Insufficient documentation

## 2022-10-17 HISTORY — DX: Herpesviral infection, unspecified: B00.9

## 2022-10-17 NOTE — MAU Note (Signed)
.  Frances [name] is a 22 y.o. at [redacted]w[redacted]d here in MAU reporting ctxs intermittently Tues but more regular since TUes night. Denies LOF or VB. Reports good FM  Onset of complaint: Tues night Pain score: 7 Vitals:   10/17/22 0309 10/17/22 0312  BP:  129/78  Pulse: 93   Resp: 17   Temp: 98.3 F (36.8 C)   SpO2: 100%      FHT:138 Lab orders placed from triage:  labor eval

## 2022-10-17 NOTE — MAU Provider Note (Signed)
Ms. Frances [name] is a [id number] at [redacted]w[redacted]d seen in MAU for labor. RN labor check, not seen by provider. SVE by RN Dilation: 2 (inner os) Effacement (%): 50 Cervical Position: Posterior Station: -3 Presentation: Vertex Exam by:: Santiago Bur, RN   NST - FHR: 140 bpm / moderate variability / accels present / decels absent / TOCO: regular every 5-7 mins   Plan:  D/C home with labor precautions Keep scheduled appt with Drenda Freeze on 8/15  Celedonio Savage, MD  10/17/2022 5:22 AM

## 2022-10-17 NOTE — MAU Note (Signed)
EFM monitor in room 1S21 was not linking to OBIX, striping being printed in room. Not archiving in OBIX to be seen. Paper strip in chart.

## 2022-10-18 ENCOUNTER — Ambulatory Visit: Payer: Medicaid Other | Admitting: Advanced Practice Midwife

## 2022-10-18 ENCOUNTER — Encounter: Payer: Self-pay | Admitting: Advanced Practice Midwife

## 2022-10-18 VITALS — BP 102/71 | HR 87 | Wt 182.0 lb

## 2022-10-18 DIAGNOSIS — Z3A37 37 weeks gestation of pregnancy: Secondary | ICD-10-CM

## 2022-10-18 DIAGNOSIS — B009 Herpesviral infection, unspecified: Secondary | ICD-10-CM

## 2022-10-18 DIAGNOSIS — Z3480 Encounter for supervision of other normal pregnancy, unspecified trimester: Secondary | ICD-10-CM

## 2022-10-18 NOTE — Patient Instructions (Signed)

## 2022-10-18 NOTE — Progress Notes (Signed)
   LOW-RISK PREGNANCY VISIT Patient name: Frances [name] MRN [id number]  Date of birth: February 03, 2001 Chief Complaint:   Routine Prenatal Visit (Seem Women's yesterday contraction)  History of Present Illness:   Frances [name] is a 22 y.o. [phone] female at [redacted]w[redacted]d with an Estimated Date of Delivery: 11/03/22 being seen today for ongoing management of a low-risk pregnancy.  Today she reports no complaints. Contractions: Irregular.  .  Movement: Present. denies leaking of fluid. Review of Systems:   Pertinent items are noted in HPI Denies abnormal vaginal discharge w/ itching/odor/irritation, headaches, visual changes, shortness of breath, chest pain, abdominal pain, severe nausea/vomiting, or problems with urination or bowel movements unless otherwise stated above. Pertinent History Reviewed:  Reviewed past medical,surgical, social, obstetrical and family history.  Reviewed problem list, medications and allergies. Physical Assessment:   Vitals:   10/18/22 0948  BP: 102/71  Pulse: 87  Weight: 182 lb (82.6 kg)  Body mass index is 32.24 kg/m.        Physical Examination:   General appearance: Well appearing, and in no distress  Mental status: Alert, oriented to person, place, and time  Skin: Warm & dry  Cardiovascular: Normal heart rate noted  Respiratory: Normal respiratory effort, no distress  Abdomen: Soft, gravid, nontender  Pelvic: Cervical exam deferred         Extremities: Edema: None  Fetal Status: Fetal Heart Rate (bpm): 141 Fundal Height: 38 cm Movement: Present    Chaperone:  N/A    No results found for this or any previous visit (from the past 24 hour(s)).  Assessment & Plan:    Pregnancy: [id number] at [redacted]w[redacted]d 1. Supervision of other normal pregnancy, antepartum   2. [redacted] weeks gestation of pregnancy   3. HSV-2 infection Continue valtrex     Meds: No orders of the defined types were placed in this encounter.  Labs/procedures today:   Plan:  Continue routine  obstetrical care  Next visit: prefers in person    Reviewed: Term labor symptoms and general obstetric precautions including but not limited to vaginal bleeding, contractions, leaking of fluid and fetal movement were reviewed in detail with the patient.  All questions were answered. Jas home bp cuff.. Check bp weekly, let us know if >140/90.   Follow-up: Return for As scheduled.  Future Appointments  Date Time Provider Department Center  10/25/2022  9:50 AM Frances [name], CNM [facility]  11/01/2022  9:50 AM Cheral Marker, CNM [facility]    No orders of the defined types were placed in this encounter.  Frances Shell DNP, CNM 10/18/2022 10:23 AM

## 2022-10-23 ENCOUNTER — Encounter (HOSPITAL_COMMUNITY): Payer: Self-pay | Admitting: Obstetrics and Gynecology

## 2022-10-23 ENCOUNTER — Other Ambulatory Visit: Payer: Self-pay

## 2022-10-23 ENCOUNTER — Inpatient Hospital Stay (HOSPITAL_COMMUNITY)
Admission: AD | Admit: 2022-10-23 | Discharge: 2022-10-24 | Disposition: A | Discharge status: Home / Self Care | Payer: Medicaid Other | Attending: Family Medicine | Admitting: Family Medicine

## 2022-10-23 DIAGNOSIS — O26893 Other specified pregnancy related conditions, third trimester: Secondary | ICD-10-CM | POA: Diagnosis not present

## 2022-10-23 DIAGNOSIS — O9832 Other infections with a predominantly sexual mode of transmission complicating childbirth: Secondary | ICD-10-CM | POA: Diagnosis present

## 2022-10-23 DIAGNOSIS — Z3A38 38 weeks gestation of pregnancy: Secondary | ICD-10-CM

## 2022-10-23 DIAGNOSIS — A6000 Herpesviral infection of urogenital system, unspecified: Secondary | ICD-10-CM | POA: Diagnosis present

## 2022-10-23 DIAGNOSIS — Z3480 Encounter for supervision of other normal pregnancy, unspecified trimester: Principal | ICD-10-CM

## 2022-10-23 LAB — CBC
HCT: 32.6 % — ABNORMAL LOW (ref 36.0–46.0)
Hemoglobin: 10.4 g/dL — ABNORMAL LOW (ref 12.0–15.0)
MCH: 25.3 pg — ABNORMAL LOW (ref 26.0–34.0)
MCHC: 31.9 g/dL (ref 30.0–36.0)
MCV: 79.3 fL — ABNORMAL LOW (ref 80.0–100.0)
Platelets: 252 10*3/uL (ref 150–400)
RBC: 4.11 MIL/uL (ref 3.87–5.11)
RDW: 13.6 % (ref 11.5–15.5)
WBC: 11.7 10*3/uL — ABNORMAL HIGH (ref 4.0–10.5)
nRBC: 0 % (ref 0.0–0.2)

## 2022-10-23 LAB — HIV ANTIBODY (ROUTINE TESTING W REFLEX): HIV Screen 4th Generation wRfx: NONREACTIVE

## 2022-10-23 MED ORDER — SOD CITRATE-CITRIC ACID 500-334 MG/5ML PO SOLN: 30.0000 mL | ORAL | Status: DC | PRN

## 2022-10-23 MED ORDER — FENTANYL CITRATE (PF) 100 MCG/2ML IJ SOLN
50.0000 ug | Freq: Once | INTRAMUSCULAR | Status: AC
  Administered 2022-10-23: 50 ug via INTRAVENOUS
  Filled 2022-10-23: qty 2

## 2022-10-23 MED ORDER — LACTATED RINGERS IV SOLN: 500.0000 mL | INTRAVENOUS | Status: DC | PRN

## 2022-10-23 MED ORDER — ONDANSETRON HCL 4 MG/2ML IJ SOLN: 4.0000 mg | INTRAMUSCULAR | Status: DC | PRN

## 2022-10-23 MED ORDER — LACTATED RINGERS IV SOLN: INTRAVENOUS | Status: DC

## 2022-10-23 MED ORDER — SENNOSIDES-DOCUSATE SODIUM 8.6-50 MG PO TABS
2.0000 | ORAL_TABLET | ORAL | Status: DC
  Administered 2022-10-24: 2 via ORAL
  Filled 2022-10-23: qty 2

## 2022-10-23 MED ORDER — OXYTOCIN 10 UNIT/ML IJ SOLN
10.0000 [IU] | Freq: Once | INTRAMUSCULAR | Status: AC | PRN
  Administered 2022-10-23: 10 [IU] via INTRAMUSCULAR

## 2022-10-23 MED ORDER — PRENATAL MULTIVITAMIN CH
1.0000 | ORAL_TABLET | Freq: Every day | ORAL | Status: DC
  Administered 2022-10-24: 1 via ORAL
  Filled 2022-10-23: qty 1

## 2022-10-23 MED ORDER — OXYTOCIN 10 UNIT/ML IJ SOLN
INTRAMUSCULAR | Status: AC
  Filled 2022-10-23: qty 1

## 2022-10-23 MED ORDER — LIDOCAINE HCL (PF) 1 % IJ SOLN: 30.0000 mL | INTRAMUSCULAR | Status: DC | PRN

## 2022-10-23 MED ORDER — ACETAMINOPHEN 325 MG PO TABS: 650.0000 mg | ORAL_TABLET | ORAL | Status: DC | PRN

## 2022-10-23 MED ORDER — OXYTOCIN-SODIUM CHLORIDE 30-0.9 UT/500ML-% IV SOLN: 2.5000 [IU]/h | INTRAVENOUS | Status: DC

## 2022-10-23 MED ORDER — SODIUM CHLORIDE 0.9% FLUSH
3.0000 mL | Freq: Two times a day (BID) | INTRAVENOUS | Status: DC
  Administered 2022-10-24: 3 mL via INTRAVENOUS

## 2022-10-23 MED ORDER — DIBUCAINE (PERIANAL) 1 % EX OINT: 1.0000 | TOPICAL_OINTMENT | CUTANEOUS | Status: DC | PRN

## 2022-10-23 MED ORDER — DIPHENHYDRAMINE HCL 25 MG PO CAPS: 25.0000 mg | ORAL_CAPSULE | Freq: Four times a day (QID) | ORAL | Status: DC | PRN

## 2022-10-23 MED ORDER — LACTATED RINGERS IV BOLUS
1000.0000 mL | Freq: Once | INTRAVENOUS | Status: AC
  Administered 2022-10-23: 1000 mL via INTRAVENOUS

## 2022-10-23 MED ORDER — WITCH HAZEL-GLYCERIN EX PADS: 1.0000 | MEDICATED_PAD | CUTANEOUS | Status: DC | PRN

## 2022-10-23 MED ORDER — BENZOCAINE-MENTHOL 20-0.5 % EX AERO: 1.0000 | INHALATION_SPRAY | CUTANEOUS | Status: DC | PRN

## 2022-10-23 MED ORDER — ZOLPIDEM TARTRATE 5 MG PO TABS: 5.0000 mg | ORAL_TABLET | Freq: Every evening | ORAL | Status: DC | PRN

## 2022-10-23 MED ORDER — SIMETHICONE 80 MG PO CHEW: 80.0000 mg | CHEWABLE_TABLET | ORAL | Status: DC | PRN

## 2022-10-23 MED ORDER — SODIUM CHLORIDE 0.9 % IV SOLN: 250.0000 mL | INTRAVENOUS | Status: DC | PRN

## 2022-10-23 MED ORDER — ONDANSETRON HCL 4 MG PO TABS: 4.0000 mg | ORAL_TABLET | ORAL | Status: DC | PRN

## 2022-10-23 MED ORDER — SODIUM CHLORIDE 0.9% FLUSH: 3.0000 mL | INTRAVENOUS | Status: DC | PRN

## 2022-10-23 MED ORDER — OXYCODONE-ACETAMINOPHEN 5-325 MG PO TABS: 1.0000 | ORAL_TABLET | ORAL | Status: DC | PRN

## 2022-10-23 MED ORDER — IBUPROFEN 600 MG PO TABS
600.0000 mg | ORAL_TABLET | Freq: Four times a day (QID) | ORAL | Status: DC
  Administered 2022-10-23 – 2022-10-24 (×3): 600 mg via ORAL
  Filled 2022-10-23 (×4): qty 1

## 2022-10-23 MED ORDER — COCONUT OIL OIL: 1.0000 | TOPICAL_OIL | Status: DC | PRN

## 2022-10-23 MED ORDER — OXYTOCIN BOLUS FROM INFUSION: 333.0000 mL | Freq: Once | INTRAVENOUS | Status: DC

## 2022-10-23 MED ORDER — FENTANYL CITRATE (PF) 100 MCG/2ML IJ SOLN: 50.0000 ug | INTRAMUSCULAR | Status: DC | PRN

## 2022-10-23 MED ORDER — ONDANSETRON HCL 4 MG/2ML IJ SOLN: 4.0000 mg | Freq: Four times a day (QID) | INTRAMUSCULAR | Status: DC | PRN

## 2022-10-23 MED ORDER — OXYCODONE-ACETAMINOPHEN 5-325 MG PO TABS: 2.0000 | ORAL_TABLET | ORAL | Status: DC | PRN

## 2022-10-23 NOTE — H&P (Signed)
OBSTETRIC ADMISSION HISTORY AND PHYSICAL  Frances [name] is a 22 y.o. female [phone] with IUP at [redacted]w[redacted]d by early [place] presenting for SOL. She reports +FMs, No LOF, no VB, no blurry vision, headaches or peripheral edema, and RUQ pain.  She plans on bottle feeding. She request Nexplanon @ [facility] for birth control. She received her prenatal care at [facility]   Dating: By early [place] --->  Estimated Date of Delivery: 11/03/22  Sono:    @[redacted]w[redacted]d , CWD, normal anatomy, cephalic presentation, posterior placenta, 305g, 66% EFW   Prenatal History/Complications:  Patient Active Problem List   Diagnosis Date Noted   [redacted] weeks gestation of pregnancy 10/23/2022   SVD (spontaneous vaginal delivery) 10/23/2022   History of preterm delivery 05/21/2022   Encounter for supervision of normal pregnancy, antepartum 04/20/2022   Calculus of gallbladder without cholecystitis without obstruction 01/10/2021   HSV-2 infection 12/29/2019   Severe recurrent major depression without psychotic features (HCC) 05/24/2018   ADHD (attention deficit hyperactivity disorder), combined type 07/30/2013   Insomnia 07/30/2013   Nocturia 07/30/2013   Oppositional defiant disorder 04/14/2011    FAMILY TREE   RESULTS   Language English Pap 10/10/22 NILM  Initiated care at 12wks GC/CT Initial:   -/-         36wks: -/-  Dating by 11/03/2022, by Ultrasound      Support person [employer] [employer] NT/IT: declined    AFP-OSB: neg      Panorama: LR female  BP cuff Has bp cuff Carrier Screen 12/30/19 neg      [place]/Hgb Elec    Rhogam N/a      TDaP vaccine 08/06/22  Blood Type A/Positive/-- (02/19 1052)  Flu vaccine N/a Antibody Negative (02/19 1052)  Covid vaccine   HBsAg Negative (02/19 1052)      RPR Non Reactive (02/19 1052)  Anatomy US Normal female Rubella  5.60 (02/19 1052)  Feeding Plan bottle HIV Non Reactive (02/19 1052)  Contraception Nexp @ [facility] Hep C neg  Circumcision N/a      Pediatrician [facility] A1C/GTT Early:       26-28wks: normal  Prenatal Classes discussed          GBS    neg [ ]  PCN allergy  BTL Consent        VBAC Consent N/a PHQ9 & GAD7  [ ] New OB  [ ] 28wks   [ ] 36wks  Waterbirth [ ] Class [ ]  36wkCNM visit/consent         Past Medical History: Past Medical History:  Diagnosis Date   ADHD (attention deficit hyperactivity disorder)    Allergy    Depressed    Gallstones    HSV-2 (herpes simplex virus 2) infection    not on medication   ODD (oppositional defiant disorder)    Post traumatic stress disorder    PTSD (post-traumatic stress disorder)     Past Surgical History: Past Surgical History:  Procedure Laterality Date   APPENDECTOMY     CHOLECYSTECTOMY N/A 01/13/2021   Procedure: LAPAROSCOPIC CHOLECYSTECTOMY;  Surgeon: Lucretia Roers, MD;  Location: [facility];  Service: General;  Laterality: N/A;   LAPAROSCOPIC APPENDECTOMY N/A 01/18/2013   Procedure: APPENDECTOMY LAPAROSCOPIC;  Surgeon: Judie Petit. Leonia Corona, MD;  Location: [facility];  Service: Pediatrics;  Laterality: N/A;   TONSILLECTOMY      Obstetrical History: OB History     Gravida  4   Para  2   Term  1   Preterm  1  AB  1   Living  2      SAB  1   IAB      Ectopic      Multiple  0   Live Births  2           Social History Social History   Socioeconomic History   Marital status: Married    Spouse name: Carra Cousino   Number of children: 2   Years of education: Not on file   Highest education level: Not on file  Occupational History   Not on file  Tobacco Use   Smoking status: Never    Passive exposure: Yes   Smokeless tobacco: Never  Vaping Use   Vaping status: Former  Substance and Sexual Activity   Alcohol use: Not Currently    Comment: occ   Drug use: No   Sexual activity: Not Currently    Birth control/protection: None  Other Topics Concern   Not on file  Social History Narrative   Not on file   Social Determinants of Health   Financial Resource Strain: Low Risk   (04/23/2022)   Overall Financial Resource Strain (CARDIA)    Difficulty of Paying Living Expenses: Not very hard  Food Insecurity: No Food Insecurity (04/23/2022)   Hunger Vital Sign    Worried About Running Out of Food in the Last Year: Never true    Ran Out of Food in the Last Year: Never true  Transportation Needs: No Transportation Needs (04/23/2022)   PRAPARE - Administrator, Civil Service (Medical): No    Lack of Transportation (Non-Medical): No  Physical Activity: Sufficiently Active (04/23/2022)   Exercise Vital Sign    Days of Exercise per Week: 5 days    Minutes of Exercise per Session: 40 min  Stress: No Stress Concern Present (04/23/2022)   [employer] of Occupational Health - Occupational Stress Questionnaire    Feeling of Stress : Not at all  Social Connections: Moderately Isolated (04/23/2022)   Social Connection and Isolation Panel [NHANES]    Frequency of Communication with Friends and Family: More than three times a week    Frequency of Social Gatherings with Friends and Family: Twice a week    Attends Religious Services: Never    Database administrator or Organizations: No    Attends Engineer, structural: Never    Marital Status: Married    Family History: Family History  Problem Relation Age of Onset   Diabetes Mother    Heart attack Mother    Cancer Maternal Grandmother    Heart disease Maternal Grandmother    Cancer Maternal Grandfather    Diabetes Maternal Grandfather    Heart disease Maternal Grandfather    Cancer Paternal Grandmother    Heart disease Paternal Grandmother    Cancer Paternal Grandfather    Heart disease Paternal Grandfather     Allergies: Allergies  Allergen Reactions   Risperidone And Related Hives   Shellfish Allergy Hives, Nausea And Vomiting and Swelling    Medications Prior to Admission  Medication Sig Dispense Refill Last Dose   Prenatal Vit-Fe Fumarate-FA (PRENATAL VITAMIN PO) Take by mouth.    10/22/2022   acyclovir (ZOVIRAX) 400 MG tablet Take 1 tablet (400 mg total) by mouth 3 (three) times daily. (Patient not taking: Reported on 10/18/2022) 90 tablet 3    EPINEPHrine 0.3 mg/0.3 mL IJ SOAJ injection Inject 0.3 mg into the muscle as needed for anaphylaxis. (Patient not  taking: Reported on 10/18/2022)        Review of Systems   All systems reviewed and negative except as stated in HPI  Blood pressure 107/72, pulse 82, temperature 98 F (36.7 C), temperature source Oral, resp. rate 18, height 5\' 3"  (1.6 m), weight 81.7 kg, last menstrual period 01/18/2022, SpO2 99%. General appearance: alert, cooperative, and appears stated age Lungs: clear to auscultation bilaterally Heart: regular rate and rhythm Abdomen: soft, non-tender; bowel sounds normal Pelvic: normal female genitalia, no obvious lesions seen exterior / vaginal  Extremities: Homans sign is negative, no sign of DVT Presentation: cephalic Fetal monitoringBaseline: 145 bpm, Variability: Good {> 6 bpm), Accelerations: Reactive, and Decelerations: Variable: mild Uterine activity Q16min Dilation: Lip/rim Effacement (%): 100 Station: -1 Exam by:: Dr. Judd Lien   Prenatal labs: ABO, Rh: A/Positive/-- (02/19 1052) Antibody: Negative (06/03 0846) Rubella: 5.60 (02/19 1052) RPR: Non Reactive (06/03 0846)  HBsAg: Negative (02/19 1052)  HIV: Non Reactive (06/03 0846)  GBS: Negative/-- (08/07 1350)  2 hr Glucola normal  Genetic screening NIPS LR, AFP neg  Anatomy US normal   Prenatal Transfer Tool  Maternal Diabetes: No Genetic Screening: Normal Maternal Ultrasounds/Referrals: Normal Fetal Ultrasounds or other Referrals:  None Maternal Substance Abuse:  No Significant Maternal Medications:  Meds include: Other:  acyclovir  Significant Maternal Lab Results:  Group B Strep negative and Other:  Number of Prenatal Visits:greater than 3 verified prenatal visits Other Comments:   States never had outbreak of HSV but arrived to  L&D antlip and barring down, inspection did not show any active lesions   Results for orders placed or performed during the hospital encounter of 10/23/22 (from the past 24 hour(s))  CBC   Collection Time: 10/23/22  3:18 PM  Result Value Ref Range   WBC 11.7 (H) 4.0 - 10.5 K/uL   RBC 4.11 3.87 - 5.11 MIL/uL   Hemoglobin 10.4 (L) 12.0 - 15.0 g/dL   HCT 69.6 (L) 29.5 - 28.4 %   MCV 79.3 (L) 80.0 - 100.0 fL   MCH 25.3 (L) 26.0 - 34.0 pg   MCHC 31.9 30.0 - 36.0 g/dL   RDW 13.2 44.0 - 10.2 %   Platelets 252 150 - 400 K/uL   nRBC 0.0 0.0 - 0.2 %    Patient Active Problem List   Diagnosis Date Noted   [redacted] weeks gestation of pregnancy 10/23/2022   History of preterm delivery 05/21/2022   Encounter for supervision of normal pregnancy, antepartum 04/20/2022   Calculus of gallbladder without cholecystitis without obstruction 01/10/2021   HSV-2 infection 12/29/2019   Severe recurrent major depression without psychotic features (HCC) 05/24/2018   ADHD (attention deficit hyperactivity disorder), combined type 07/30/2013   Insomnia 07/30/2013   Nocturia 07/30/2013   Oppositional defiant disorder 04/14/2011    Assessment/Plan:  Laurn Dembski is a 22 y.o. [id number] at [redacted]w[redacted]d here for SOL  #Labor: expected management  #Pain: Per patient request  #FWB: Cat 1 #ID:  GBS neg #MOF: bottle #MOC: OP Nexplanon  #HSV: prescribed acyclovir, never had an outbreak per report, negative for exterior or vaginal lesions on exam during delivery   Hessie Dibble, MD  10/23/2022, 3:42 PM

## 2022-10-23 NOTE — Discharge Summary (Signed)
Postpartum Discharge Summary   Patient Name: Frances [name] DOB: [birth year] MRN: [id number]  Date of admission: 10/23/2022 Delivery date:10/23/2022 Delivering provider: CHUBB, CASEY C Date of discharge: 10/24/2022  Admitting diagnosis: [redacted] weeks gestation of pregnancy [Z3A.38] Intrauterine pregnancy: [redacted]w[redacted]d     Secondary diagnosis:  Principal Problem:   [redacted] weeks gestation of pregnancy Active Problems:   SVD (spontaneous vaginal delivery)    Discharge diagnosis: Term Pregnancy Delivered                                              Post partum procedures: none Augmentation: N/A Complications: None  Hospital course: Onset of Labor With Vaginal Delivery      22 y.o. yo [phone] at [redacted]w[redacted]d was admitted in Active Labor on 10/23/2022. Labor course was complicated by nothing.   Membrane Rupture Time/Date: 3:22 PM,10/23/2022  Delivery Method:Vaginal, Spontaneous Operative Delivery:N/A Episiotomy: None Lacerations:    Patient had a postpartum course complicated by nothing.  She is ambulating, tolerating a regular diet, passing flatus, and urinating well. Patient is discharged home in stable condition on 10/24/22.  Newborn Data: Birth date:10/23/2022 Birth time:3:26 PM Gender:Female Living status:Living Apgars:8 ,9  Weight:2920 g  Magnesium Sulfate received: No BMZ received: No Rhophylac:N/A MMR:N/A T-DaP:Given prenatally  Physical exam  Vitals:   10/23/22 1823 10/23/22 2214 10/24/22 0205 10/24/22 0523  BP: 114/79 111/71 120/64 113/74  Pulse: 76 82 92 73  Resp: 16 18 18 16   Temp: 98.6 F (37 C) 99.2 F (37.3 C) 99.4 F (37.4 C) 98 F (36.7 C)  TempSrc: Oral Oral Oral Oral  SpO2:  100% 100% 98%  Weight:      Height:       General: alert Lochia: appropriate Uterine Fundus: firm, nttp Incision: N/A DVT Evaluation: No evidence of DVT seen on physical exam. Labs: Lab Results  Component Value Date   WBC 14.4 (H) 10/24/2022   HGB 9.6 (L) 10/24/2022   HCT 28.6 (L)  10/24/2022   MCV 79.4 (L) 10/24/2022   PLT 248 10/24/2022      Latest Ref Rng & Units 12/20/2020    1:13 PM  CMP  Glucose 70 - 99 mg/dL 84   BUN 6 - 20 mg/dL 12   Creatinine 2.72 - 1.00 mg/dL 5.36   Sodium 644 - 034 mmol/L 137   Potassium 3.5 - 5.1 mmol/L 3.5   Chloride 98 - 111 mmol/L 107   CO2 22 - 32 mmol/L 23   Calcium 8.9 - 10.3 mg/dL 8.8   Total Protein 6.5 - 8.1 g/dL 7.5   Total Bilirubin 0.3 - 1.2 mg/dL 1.0   Alkaline Phos 38 - 126 U/L 99   AST 15 - 41 U/L 30   ALT 0 - 44 U/L 97    Edinburgh Score:    07/21/2020   11:41 AM  Edinburgh Postnatal Depression Scale Screening Tool  I have been able to laugh and see the funny side of things. 0  I have looked forward with enjoyment to things. 0  I have blamed myself unnecessarily when things went wrong. 0  I have been anxious or worried for no good reason. 0  I have felt scared or panicky for no good reason. 0  Things have been getting on top of me. 0  I have been so unhappy that I have had difficulty sleeping. 0  I have felt sad or miserable. 0  I have been so unhappy that I have been crying. 0  The thought of harming myself has occurred to me. 0  Edinburgh Postnatal Depression Scale Total 0     After visit meds:  Allergies as of 10/24/2022       Reactions   Risperidone And Related Hives   Shellfish Allergy Hives, Nausea And Vomiting, Swelling        Medication List     STOP taking these medications    acyclovir 400 MG tablet Commonly known as: ZOVIRAX   EPINEPHrine 0.3 mg/0.3 mL Soaj injection Commonly known as: EPI-PEN       TAKE these medications    acetaminophen 325 MG tablet Commonly known as: Tylenol Take 2 tablets (650 mg total) by mouth every 4 (four) hours as needed (for pain scale < 4).   ibuprofen 600 MG tablet Commonly known as: ADVIL Take 1 tablet (600 mg total) by mouth every 6 (six) hours as needed.   PRENATAL VITAMIN PO Take by mouth.        Discharge home in stable  condition Infant Feeding: Bottle Infant Disposition:home with mother  Discharge instruction: per After Visit Summary and Postpartum booklet. Activity: Advance as tolerated. Pelvic rest for 6 weeks.  Diet: routine diet Future Appointments: Future Appointments  Date Time Provider Department Center  10/31/2022 10:50 AM Sue Lush, FNP [facility]  12/12/2022 11:10 AM Arabella Merles, CNM [facility]   Follow up Visit:  Follow-up Information     [facility]. Go on 10/31/2022.   Specialty: Obstetrics and Gynecology Contact information: [address] [place] [address] [phone]               Message sent to [facility] 8/20  Please schedule this patient for a In person postpartum visit in 6 weeks with the following provider: Any provider. Additional Postpartum F/U:Postpartum Depression checkup  Low risk pregnancy complicated by:  HSV Delivery mode:  Vaginal, Spontaneous Anticipated Birth Control:   Nexplanon @ [facility]   10/24/2022 Preston Bing, MD

## 2022-10-23 NOTE — Progress Notes (Signed)
Cervical exam done per pt request

## 2022-10-23 NOTE — MAU Note (Signed)
Frances [name] is a 22 y.o. at [redacted]w[redacted]d here in MAU reporting: she's having ctxs that are 1.5-2 minutes apart.  States ctxs began @ 0600 this morning but worsened @ 1100.  Denies LOF or VB.  Endorses +FM. LMP: NA Onset of complaint: today Pain score: 8 Vitals:   10/23/22 1342  BP: 117/68  Pulse: 96  Resp: 18  Temp: 98 F (36.7 C)  SpO2: 98%     FHT:145 bpm Lab orders placed from triage:   none

## 2022-10-23 NOTE — Lactation Note (Signed)
This note was copied from a baby's chart. Lactation Consultation Note Mom chooses to formula feed.  Patient Name: Frances [name] [id number] Date: 10/23/2022 Age:23 hours     Maternal Data    Feeding Nipple Type: Slow - flow  LATCH Score                    Lactation Tools Discussed/Used    Interventions    Discharge    Consult Status Consult Status: Complete    Charyl Dancer 10/23/2022, 7:31 PM

## 2022-10-24 LAB — CBC
HCT: 28.6 % — ABNORMAL LOW (ref 36.0–46.0)
Hemoglobin: 9.6 g/dL — ABNORMAL LOW (ref 12.0–15.0)
MCH: 26.7 pg (ref 26.0–34.0)
MCHC: 33.6 g/dL (ref 30.0–36.0)
MCV: 79.4 fL — ABNORMAL LOW (ref 80.0–100.0)
Platelets: 248 10*3/uL (ref 150–400)
RBC: 3.6 MIL/uL — ABNORMAL LOW (ref 3.87–5.11)
RDW: 13.6 % (ref 11.5–15.5)
WBC: 14.4 10*3/uL — ABNORMAL HIGH (ref 4.0–10.5)
nRBC: 0 % (ref 0.0–0.2)

## 2022-10-24 LAB — RPR: RPR Ser Ql: NONREACTIVE

## 2022-10-24 MED ORDER — IBUPROFEN 600 MG PO TABS: 600.0000 mg | ORAL_TABLET | Freq: Four times a day (QID) | ORAL | Status: DC | PRN

## 2022-10-24 MED ORDER — ACETAMINOPHEN 325 MG PO TABS: 650.0000 mg | ORAL_TABLET | ORAL | Status: AC | PRN

## 2022-10-24 NOTE — Plan of Care (Signed)
Adequate for discharge.

## 2022-10-25 ENCOUNTER — Encounter: Payer: Medicaid Other | Admitting: Advanced Practice Midwife

## 2022-10-31 ENCOUNTER — Ambulatory Visit (INDEPENDENT_AMBULATORY_CARE_PROVIDER_SITE_OTHER): Payer: Medicaid Other | Admitting: Obstetrics and Gynecology

## 2022-10-31 ENCOUNTER — Encounter: Payer: Self-pay | Admitting: Obstetrics and Gynecology

## 2022-10-31 DIAGNOSIS — F53 Postpartum depression: Secondary | ICD-10-CM | POA: Diagnosis not present

## 2022-10-31 DIAGNOSIS — Z30017 Encounter for initial prescription of implantable subdermal contraceptive: Secondary | ICD-10-CM | POA: Diagnosis not present

## 2022-10-31 MED ORDER — ETONOGESTREL 68 MG ~~LOC~~ IMPL
68.0000 mg | DRUG_IMPLANT | Freq: Once | SUBCUTANEOUS | Status: AC
Start: 2022-10-31 — End: 2022-10-31
  Administered 2022-10-31: 68 mg via SUBCUTANEOUS

## 2022-10-31 MED ORDER — SERTRALINE HCL 50 MG PO TABS
50.0000 mg | ORAL_TABLET | Freq: Every day | ORAL | 3 refills | Status: AC
Start: 2022-10-31 — End: ?

## 2022-10-31 NOTE — Progress Notes (Signed)
   GYNECOLOGY PROGRESS NOTE  History:  22 y.o. [id number] presents to [facility] office today for 1 week mood check. She denies h/a, dizziness, shortness of breath, n/v, or fever/chills.  Reports doing "okay". She reports this is how it usually starts pp and then it gets really bad. She would like to try something to prevent that. Denies SI/HI. Is also interested in Nexplanon today. Has had positive experience with nexplanon before.    Health Maintenance Due  Topic Date Due   HPV VACCINES (1 - 3-dose series) Never done   COVID-19 Vaccine (1 - 2023-24 season) Never done   INFLUENZA VACCINE  10/04/2022     Review of Systems:  Pertinent items are noted in HPI.   Objective:  Physical Exam Blood pressure 124/84, pulse 94, height 5\' 3"  (1.6 m), weight 168 lb (76.2 kg), last menstrual period 01/18/2022, not currently breastfeeding. VS reviewed, nursing note reviewed,  Constitutional: well developed, well nourished, no distress HEENT: normocephalic CV: normal rate Pulm/chest wall: normal effort Breast Exam: deferred Abdomen: soft Neuro: alert and oriented x 3 Skin: warm, dry Psych: affect normal Pelvic exam: deferred GYNECOLOGY OFFICE PROCEDURE NOTE  Frances [name] is a 22 y.o. [id number] here for Nexplanon insertion.  Last pap smear was on 10/10/22 and was normal.  No other gynecologic concerns.  Nexplanon Insertion Procedure Patient identified, informed consent performed, consent signed.   Patient does understand that irregular bleeding is a very common side effect of this medication. She was advised to have backup contraception for one week after placement. Pregnancy test in clinic today was negative.  Appropriate time out taken.    Patient's left arm was prepped and draped in the usual sterile fashion. The ruler used to measure and mark insertion area.  Patient was prepped with alcohol swab and then injected with 3 ml of 1% lidocaine.  She was prepped with betadine, Nexplanon removed  from packaging,  Device confirmed in needle, then inserted full length of needle and withdrawn per handbook instructions. Nexplanon was able to palpated in the patient's arm; patient palpated the insert herself. There was minimal blood loss.  Patient insertion site covered with guaze and a pressure bandage to reduce any bruising.    The patient tolerated the procedure well and was given post procedure instructions.   The patient tolerated the procedure well and was given post procedure instructions.     Assessment & Plan:  1. Postpartum care following vaginal delivery  - etonogestrel (NEXPLANON) implant 68 mg  2. Nexplanon insertion  - etonogestrel (NEXPLANON) implant 68 mg  3. Postpartum depression Hx severe ppd, would like to start meds today. Instructed to start 25 mg for one week and increase. Side effects and black box warning discussed. Notified of 24/7 behavioral health urgent care if needed  - sertraline (ZOLOFT) 50 MG tablet; Take 1 tablet (50 mg total) by mouth daily. Take 1/2 pill (25 mg) for 7 days. Then take whole pill  Dispense: 30 tablet; Refill: 3   Future Appointments  Date Time Provider Department Center  12/12/2022 11:10 AM Arabella Merles, CNM [facility]   Albertine Grates, FNP 11:24 AM

## 2022-11-01 ENCOUNTER — Encounter: Payer: Medicaid Other | Admitting: Women's Health

## 2022-11-15 DIAGNOSIS — E611 Iron deficiency: Secondary | ICD-10-CM | POA: Diagnosis not present

## 2022-11-15 DIAGNOSIS — T8141XA Infection following a procedure, superficial incisional surgical site, initial encounter: Secondary | ICD-10-CM | POA: Diagnosis not present

## 2022-11-15 DIAGNOSIS — R945 Abnormal results of liver function studies: Secondary | ICD-10-CM | POA: Diagnosis not present

## 2022-11-15 DIAGNOSIS — Z139 Encounter for screening, unspecified: Secondary | ICD-10-CM | POA: Diagnosis not present

## 2022-11-15 DIAGNOSIS — F418 Other specified anxiety disorders: Secondary | ICD-10-CM | POA: Diagnosis not present

## 2022-12-05 ENCOUNTER — Encounter: Payer: Self-pay | Admitting: Women's Health

## 2022-12-05 ENCOUNTER — Ambulatory Visit: Payer: Medicaid Other | Admitting: Women's Health

## 2022-12-05 DIAGNOSIS — F418 Other specified anxiety disorders: Secondary | ICD-10-CM | POA: Diagnosis not present

## 2022-12-05 NOTE — Progress Notes (Signed)
POSTPARTUM VISIT Patient name: Frances [name] MRN [id number]  Date of birth: [birth year] Chief Complaint:   Postpartum Care  History of Present Illness:   Aleaha Fickling is a 22 y.o. [phone] Caucasian female being seen today for a postpartum visit. She is 6 weeks postpartum following a spontaneous vaginal delivery at 38.3 gestational weeks. IOL: no, for n/a. Anesthesia: none.  Laceration: none.  Complications: none. Inpatient contraception: no.   Pregnancy uncomplicated. Tobacco use: no. Substance use disorder: no. Last pap smear: 10/10/22 and results were NILM w/ HRHPV not done. Next pap smear due: 2027 No LMP recorded.  Postpartum course has been uncomplicated. Bleeding none. Bowel function is normal. Bladder function is normal. Urinary incontinence? no, fecal incontinence? no Patient is not sexually active. Last sexual activity: prior to birth of baby. Desired contraception: Nexplanon inserted 10/31/22. Patient does want a pregnancy in the future.  Desired family size is 4 children.   Upstream - 12/05/22 1028       Pregnancy Intention Screening   Does the patient want to become pregnant in the next year? No    Does the patient's partner want to become pregnant in the next year? No    Would the patient like to discuss contraceptive options today? No      Contraception Wrap Up   Current Method Hormonal Implant    End Method Hormonal Implant    Contraception Counseling Provided No            The pregnancy intention screening data noted above was reviewed. Potential methods of contraception were discussed. The patient elected to proceed with Hormonal Implant.  Edinburgh Postpartum Depression Screening: negative, doing well on zoloft 50mg   Edinburgh Postnatal Depression Scale - 12/05/22 1029       Edinburgh Postnatal Depression Scale:  In the Past 7 Days   I have been able to laugh and see the funny side of things. 0    I have looked forward with enjoyment to things. 0    I  have blamed myself unnecessarily when things went wrong. 0    I have been anxious or worried for no good reason. 0    I have felt scared or panicky for no good reason. 0    Things have been getting on top of me. 0    I have been so unhappy that I have had difficulty sleeping. 0    I have felt sad or miserable. 0    I have been so unhappy that I have been crying. 0    The thought of harming myself has occurred to me. 0    Edinburgh Postnatal Depression Scale Total 0                04/23/2022   10:31 AM 04/25/2020    9:41 AM 12/30/2019    2:58 PM  GAD 7 : Generalized Anxiety Score  Nervous, Anxious, on Edge 0 1 2  Control/stop worrying 0 0 2  Worry too much - different things 0 0 2  Trouble relaxing 0 0 2  Restless 0 0 2  Easily annoyed or irritable 0 1 2  Afraid - awful might happen 0 0 2  Total GAD 7 Score 0 2 14     Baby's course has been uncomplicated. Baby is feeding by bottle. Infant has a pediatrician/family doctor? Yes.  Childcare strategy if returning to work/school: n/a-stay at home mom.  Pt has material needs met for her and baby: Yes.   Review  of Systems:   Pertinent items are noted in HPI Denies Abnormal vaginal discharge w/ itching/odor/irritation, headaches, visual changes, shortness of breath, chest pain, abdominal pain, severe nausea/vomiting, or problems with urination or bowel movements. Pertinent History Reviewed:  Reviewed past medical,surgical, obstetrical and family history.  Reviewed problem list, medications and allergies. OB History  Gravida Para Term Preterm AB Living  4 3 2 1 1 3   SAB IAB Ectopic Multiple Live Births  1     0 3    # Outcome Date GA Lbr Len/2nd Weight Sex Type Anes PTL Lv  4 Term 10/23/22 [redacted]w[redacted]d 04:22 / 00:04 6 lb 7 oz (2.92 kg) F Vag-Spont None  LIV  3 Preterm 06/16/20 [redacted]w[redacted]d 08:00 / 00:12 5 lb 14.9 oz (2.69 kg) M Vag-Spont EPI  LIV  2 Term 07/15/19 [redacted]w[redacted]d  6 lb 4 oz (2.835 kg) F Vag-Spont EPI N LIV  1 SAB            Physical  Assessment:   Vitals:   12/05/22 1028  BP: 112/78  Pulse: 76  Weight: 164 lb (74.4 kg)  Height: 5\' 3"  (1.6 m)  Body mass index is 29.05 kg/m.       Physical Examination:   General appearance: alert, well appearing, and in no distress  Mental status: alert, oriented to person, place, and time  Skin: warm & dry   Cardiovascular: normal heart rate noted   Respiratory: normal respiratory effort, no distress   Breasts: deferred, no complaints   Abdomen: soft, non-tender   Pelvic: examination not indicated. Thin prep pap obtained: No  Rectal: not examined  Extremities: Edema: none   Chaperone: N/A         No results found for this or any previous visit (from the past 24 hour(s)).  Assessment & Plan:  1) Postpartum exam 2) 6 wks s/p spontaneous vaginal delivery 3) bottle feeding 4) Depression screening 5) Contraception s/p Nexplanon on 8/28 6) Dep/anx> doing well on zoloft 66m  Essential components of care per ACOG recommendations:  1.  Mood and well being:  If positive depression screen, discussed and plan developed.  If using tobacco we discussed reduction/cessation and risk of relapse If current substance abuse, we discussed and referral to local resources was offered.   2. Infant care and feeding:  If breastfeeding, discussed returning to work, pumping, breastfeeding-associated pain, guidance regarding return to fertility while lactating if not using another method. If needed, patient was provided with a letter to be allowed to pump q 2-3hrs to support lactation in a private location with access to a refrigerator to store breastmilk.   Recommended that all caregivers be immunized for flu, pertussis and other preventable communicable diseases If pt does not have material needs met for her/baby, referred to local resources for help obtaining these.  3. Sexuality, contraception and birth spacing Provided guidance regarding sexuality, management of dyspareunia, and resumption  of intercourse Discussed avoiding interpregnancy interval <102mths and recommended birth spacing of 18 months  4. Sleep and fatigue Discussed coping options for fatigue and sleep disruption Encouraged family/partner/community support of 4 hrs of uninterrupted sleep to help with mood and fatigue  5. Physical recovery  If pt had a C/S, assessed incisional pain and providing guidance on normal vs prolonged recovery If pt had a laceration, perineal healing and pain reviewed.  If urinary or fecal incontinence, discussed management and referred to PT or uro/gyn if indicated  Patient is safe to resume physical activity. Discussed attainment of  healthy weight.  6.  Chronic disease management Discussed pregnancy complications if any, and their implications for future childbearing and long-term maternal health. Review recommendations for prevention of recurrent pregnancy complications, such as 17 hydroxyprogesterone caproate to reduce risk for recurrent PTB not applicable, or aspirin to reduce risk of preeclampsia not applicable. Pt had GDM: no. If yes, 2hr GTT scheduled: not applicable. Reviewed medications and non-pregnant dosing including consideration of whether pt is breastfeeding using a reliable resource such as LactMed: not applicable Referred for f/u w/ PCP or subspecialist providers as indicated: not applicable  7. Health maintenance Mammogram at 23yo or earlier if indicated Pap smears as indicated  Meds: No orders of the defined types were placed in this encounter.   Follow-up: Return in about 1 year (around 12/05/2023) for Physical.   No orders of the defined types were placed in this encounter.   Cheral Marker CNM, [facility] 12/05/2022 10:42 AM

## 2022-12-12 ENCOUNTER — Ambulatory Visit: Payer: Medicaid Other | Admitting: Advanced Practice Midwife

## 2023-02-08 ENCOUNTER — Encounter (HOSPITAL_COMMUNITY): Payer: Self-pay

## 2023-02-08 ENCOUNTER — Emergency Department (HOSPITAL_COMMUNITY)
Admission: EM | Admit: 2023-02-08 | Discharge: 2023-02-08 | Disposition: A | Discharge status: Home / Self Care | Payer: Medicaid Other | Attending: Emergency Medicine | Admitting: Emergency Medicine

## 2023-02-08 ENCOUNTER — Other Ambulatory Visit: Payer: Self-pay

## 2023-02-08 DIAGNOSIS — L0291 Cutaneous abscess, unspecified: Secondary | ICD-10-CM

## 2023-02-08 DIAGNOSIS — L02211 Cutaneous abscess of abdominal wall: Secondary | ICD-10-CM | POA: Insufficient documentation

## 2023-02-08 DIAGNOSIS — L02214 Cutaneous abscess of groin: Secondary | ICD-10-CM | POA: Diagnosis not present

## 2023-02-08 MED ORDER — DOXYCYCLINE HYCLATE 100 MG PO TABS
100.0000 mg | ORAL_TABLET | Freq: Once | ORAL | Status: AC
  Administered 2023-02-08: 100 mg via ORAL
  Filled 2023-02-08: qty 1

## 2023-02-08 MED ORDER — DOXYCYCLINE HYCLATE 100 MG PO CAPS: 100.0000 mg | ORAL_CAPSULE | Freq: Two times a day (BID) | ORAL | 0 refills | Status: AC

## 2023-02-08 NOTE — ED Notes (Signed)
Area visualized; redness and pus drainage noted.

## 2023-02-08 NOTE — Discharge Instructions (Addendum)
Use warm compresses several times a day.  Return if you run a fever or if you start having worsening drainage.

## 2023-02-08 NOTE — ED Triage Notes (Signed)
Pt reports:  Abscess  Under stomach  Ongoing 3 days Drainage: blood, white pus

## 2023-02-08 NOTE — ED Provider Notes (Signed)
[place] EMERGENCY DEPARTMENT AT [facility] Provider Note   CSN: [id number] Arrival date & time: 02/08/23  1754     History  Chief Complaint  Patient presents with   Abscess    Frances [name] is a 22 y.o. female.  The history is provided by the patient.  Abscess She complains of a draining abscess in the suprapubic area.  It has been present for 3 days.  It initially was painful, then started spontaneously draining some blood and purulent material.  She denies fever or chills.  She is 3 months postpartum but has Nexplanon implant for contraception, she is not breast-feeding.   Home Medications Prior to Admission medications   Medication Sig Start Date End Date Taking? Authorizing Provider  doxycycline (VIBRAMYCIN) 100 MG capsule Take 1 capsule (100 mg total) by mouth 2 (two) times daily. 02/08/23  Yes Dione Booze, MD  acetaminophen (TYLENOL) 325 MG tablet Take 2 tablets (650 mg total) by mouth every 4 (four) hours as needed (for pain scale < 4). 10/24/22   [place] Bing, MD  sertraline (ZOLOFT) 50 MG tablet Take 1 tablet (50 mg total) by mouth daily. Take 1/2 pill (25 mg) for 7 days. Then take whole pill 10/31/22   Sue Lush, FNP      Allergies    Risperidone and related and Shellfish allergy    Review of Systems   Review of Systems  All other systems reviewed and are negative.   Physical Exam Updated Vital Signs BP (!) 136/94 (BP Location: Right Arm)   Pulse 95   Temp 99.9 F (37.7 C) (Oral)   Resp 18   Ht 5\' 3"  (1.6 m)   Wt 73.9 kg   SpO2 100%   BMI 28.87 kg/m  Physical Exam Vitals and nursing note reviewed.   22 year old female, resting comfortably and in no acute distress. Vital signs are significant for borderline elevated blood pressure. Oxygen saturation is 100%, which is normal. Head is normocephalic and atraumatic. PERRLA, EOMI. Lungs are clear without rales, wheezes, or rhonchi. Chest is nontender. Heart has regular rate and  rhythm without murmur. Abdomen is soft, flat, nontender.  Wound is present in the suprapubic area with minimal erythema, no active drainage, no induration or fluctuance. Skin is warm and dry without other rash. Neurologic: Mental status is normal, moves all extremities equally.  ED Results / Procedures / Treatments   Labs (all labs ordered are listed, but only abnormal results are displayed) Labs Reviewed - No data to display  EKG None  Radiology No results found.  Procedures Procedures    Medications Ordered in ED Medications  doxycycline (VIBRA-TABS) tablet 100 mg (has no administration in time range)    ED Course/ Medical Decision Making/ A&P                                 Medical Decision Making Risk Prescription drug management.   Suprapubic abscess which appears to have spontaneously drained.  No indication for incision and drainage in the emergency department.  I am discharging her with a prescription for doxycycline.  Final Clinical Impression(s) / ED Diagnoses Final diagnoses:  Abscess    Rx / DC Orders ED Discharge Orders          Ordered    doxycycline (VIBRAMYCIN) 100 MG capsule  2 times daily        02/08/23 2326  Dione Booze, MD 02/08/23 2330

## 2023-05-30 DIAGNOSIS — S9032XA Contusion of left foot, initial encounter: Secondary | ICD-10-CM | POA: Diagnosis not present

## 2023-05-31 DIAGNOSIS — Z79899 Other long term (current) drug therapy: Secondary | ICD-10-CM | POA: Diagnosis not present

## 2023-05-31 DIAGNOSIS — F418 Other specified anxiety disorders: Secondary | ICD-10-CM | POA: Diagnosis not present

## 2023-10-27 DIAGNOSIS — N390 Urinary tract infection, site not specified: Secondary | ICD-10-CM | POA: Diagnosis not present

## 2023-11-01 DIAGNOSIS — R1032 Left lower quadrant pain: Secondary | ICD-10-CM | POA: Diagnosis not present

## 2023-12-25 DIAGNOSIS — R0600 Dyspnea, unspecified: Secondary | ICD-10-CM | POA: Diagnosis not present

## 2023-12-25 DIAGNOSIS — F418 Other specified anxiety disorders: Secondary | ICD-10-CM | POA: Diagnosis not present

## 2023-12-25 DIAGNOSIS — E663 Overweight: Secondary | ICD-10-CM | POA: Diagnosis not present

## 2023-12-25 DIAGNOSIS — A749 Chlamydial infection, unspecified: Secondary | ICD-10-CM | POA: Diagnosis not present

## 2023-12-25 DIAGNOSIS — R945 Abnormal results of liver function studies: Secondary | ICD-10-CM | POA: Diagnosis not present

## 2023-12-25 DIAGNOSIS — E785 Hyperlipidemia, unspecified: Secondary | ICD-10-CM | POA: Diagnosis not present

## 2023-12-25 DIAGNOSIS — Z23 Encounter for immunization: Secondary | ICD-10-CM | POA: Diagnosis not present

## 2023-12-25 DIAGNOSIS — Z6828 Body mass index (BMI) 28.0-28.9, adult: Secondary | ICD-10-CM | POA: Diagnosis not present

## 2023-12-25 DIAGNOSIS — Z91013 Allergy to seafood: Secondary | ICD-10-CM | POA: Diagnosis not present

## 2023-12-25 DIAGNOSIS — Z0001 Encounter for general adult medical examination with abnormal findings: Secondary | ICD-10-CM | POA: Diagnosis not present

## 2024-01-07 DIAGNOSIS — Z91013 Allergy to seafood: Secondary | ICD-10-CM | POA: Diagnosis not present

## 2024-01-07 DIAGNOSIS — R0600 Dyspnea, unspecified: Secondary | ICD-10-CM | POA: Diagnosis not present

## 2024-01-07 DIAGNOSIS — Z202 Contact with and (suspected) exposure to infections with a predominantly sexual mode of transmission: Secondary | ICD-10-CM | POA: Diagnosis not present

## 2024-01-28 DIAGNOSIS — F331 Major depressive disorder, recurrent, moderate: Secondary | ICD-10-CM | POA: Diagnosis not present
